# Patient Record
Sex: Female | Born: 1975 | Race: White | Hispanic: No | Marital: Married | Smoking: Never smoker
Health system: Southern US, Community
[De-identification: ages and names within clinical notes are randomized; demographics above are authoritative.]

## PROBLEM LIST (undated history)

## (undated) ENCOUNTER — Inpatient Hospital Stay (HOSPITAL_COMMUNITY): Payer: BC Managed Care – PPO

## (undated) DIAGNOSIS — F32A Depression, unspecified: Secondary | ICD-10-CM

## (undated) DIAGNOSIS — Z8759 Personal history of other complications of pregnancy, childbirth and the puerperium: Secondary | ICD-10-CM

## (undated) DIAGNOSIS — F329 Major depressive disorder, single episode, unspecified: Secondary | ICD-10-CM

## (undated) DIAGNOSIS — F419 Anxiety disorder, unspecified: Secondary | ICD-10-CM

## (undated) DIAGNOSIS — G43909 Migraine, unspecified, not intractable, without status migrainosus: Secondary | ICD-10-CM

## (undated) DIAGNOSIS — R112 Nausea with vomiting, unspecified: Secondary | ICD-10-CM

## (undated) DIAGNOSIS — O139 Gestational [pregnancy-induced] hypertension without significant proteinuria, unspecified trimester: Secondary | ICD-10-CM

## (undated) DIAGNOSIS — Z9889 Other specified postprocedural states: Secondary | ICD-10-CM

## (undated) HISTORY — PX: SHOULDER ARTHROSCOPY: SHX128

## (undated) HISTORY — PX: SINUS SURGERY WITH INSTATRAK: SHX5215

## (undated) HISTORY — DX: Gestational (pregnancy-induced) hypertension without significant proteinuria, unspecified trimester: O13.9

## (undated) HISTORY — PX: BREAST BIOPSY: SHX20

## (undated) HISTORY — DX: Personal history of other complications of pregnancy, childbirth and the puerperium: Z87.59

---

## 2007-07-03 ENCOUNTER — Inpatient Hospital Stay (HOSPITAL_COMMUNITY): Admission: AD | Admit: 2007-07-03 | Discharge: 2007-07-03 | Payer: Self-pay | Admitting: Obstetrics and Gynecology

## 2007-07-04 ENCOUNTER — Inpatient Hospital Stay (HOSPITAL_COMMUNITY): Admission: AD | Admit: 2007-07-04 | Discharge: 2007-07-07 | Payer: Self-pay | Admitting: Obstetrics and Gynecology

## 2007-07-08 ENCOUNTER — Inpatient Hospital Stay (HOSPITAL_COMMUNITY): Admission: AD | Admit: 2007-07-08 | Discharge: 2007-07-08 | Payer: Self-pay | Admitting: Obstetrics and Gynecology

## 2007-07-22 ENCOUNTER — Inpatient Hospital Stay (HOSPITAL_COMMUNITY): Admission: AD | Admit: 2007-07-22 | Discharge: 2007-07-22 | Payer: Self-pay | Admitting: Obstetrics and Gynecology

## 2010-12-09 NOTE — H&P (Signed)
NAME:  Miranda [name], [place] NO.:  [id number]   MEDICAL RECORD NO.:  [id number]          PATIENT TYPE:  INP   LOCATION:  9160                          FACILITY:  [facility]   PHYSICIAN:  Duke Salvia. Marcelle Overlie, M.D.DATE OF BIRTH:  1976-05-21   DATE OF ADMISSION:  07/04/2007  DATE OF DISCHARGE:                              HISTORY & PHYSICAL   CHIEF COMPLAINT:  Preeclampsia at term.   HISTORY OF PRESENT ILLNESS:  A 35 year old G2, P57 with EDD of July 23, 2007.  She was seen over the weekend complaining of some swelling  and headache.  Evaluation at [facility] by her report showed normal lab work.  She was told to come back today.  This morning, she is complaining of  blurred vision and headache.  3+ protein, BP 118/88.  She is admitted  now for labor induction secondary to preeclampsia at term.   GBS was negative.  Her blood type is A positive, rubella titers immune.   PAST MEDICAL HISTORY:  Please see her [facility] form for details.   PHYSICAL EXAMINATION:  VITAL SIGNS:  Temperature 98.2, blood pressure  118/88.  HEENT:  Unremarkable.  NECK:  Supple without masses.  LUNGS:  Clear.  CARDIOVASCULAR:  Regular rate and rhythm without murmurs, rubs, or  gallops.  PELVIC:  37-cm fundal height, vertex.  Fetal heart rate 140s.  Cervix  was a 1 cm, 50%, posterior.  Membranes intact.  EXTREMITIES:  1+ edema.  Reflexes 2+, no clonus.   IMPRESSION:  1. 37+-week intrauterine pregnancy.  2. Mild preeclampsia.   PLAN:  Will admit for labor induction.  Recheck PIH labs.      Richard M. Marcelle Overlie, M.D.  Electronically Signed     [facility]/MEDQ  D:  07/04/2007  T:  07/04/2007  Job:  [id number]

## 2011-05-04 LAB — COMPREHENSIVE METABOLIC PANEL WITH GFR
ALT: 14
ALT: 15
ALT: 17
AST: 17
AST: 21
AST: 23
Albumin: 2.3 — ABNORMAL LOW
Albumin: 2.5 — ABNORMAL LOW
Albumin: 2.8 — ABNORMAL LOW
Alkaline Phosphatase: 101
Alkaline Phosphatase: 103
Alkaline Phosphatase: 116
BUN: 11
BUN: 9
BUN: 9
CO2: 22
CO2: 24
CO2: 26
Calcium: 8.6
Calcium: 8.7
Calcium: 8.8
Chloride: 108
Chloride: 108
Chloride: 111
Creatinine, Ser: 0.66
Creatinine, Ser: 0.71
Creatinine, Ser: 0.83
GFR calc non Af Amer: >60
GFR calc non Af Amer: >60
GFR calc non Af Amer: >60
Glucose, Bld: 104 — ABNORMAL HIGH
Glucose, Bld: 87
Glucose, Bld: 92
Potassium: 4
Potassium: 4.2
Potassium: 4.3
Sodium: 137
Sodium: 139
Sodium: 141
Total Bilirubin: 0.4
Total Bilirubin: 0.5
Total Bilirubin: 0.7
Total Protein: 4.7 — ABNORMAL LOW
Total Protein: 5 — ABNORMAL LOW
Total Protein: 5.5 — ABNORMAL LOW

## 2011-05-04 LAB — URINALYSIS, ROUTINE W REFLEX MICROSCOPIC
Bilirubin Urine: NEGATIVE
Glucose, UA: NEGATIVE
Ketones, ur: NEGATIVE
Leukocytes, UA: NEGATIVE
Nitrite: NEGATIVE
Protein, ur: 30 — AB
Specific Gravity, Urine: <1.005 — ABNORMAL LOW
Urobilinogen, UA: 0.2
pH: 6

## 2011-05-04 LAB — CBC
HCT: 33 — ABNORMAL LOW
HCT: 34 — ABNORMAL LOW
HCT: 34.2 — ABNORMAL LOW
HCT: 37.4
HCT: 37.7
Hemoglobin: 11.6 — ABNORMAL LOW
Hemoglobin: 11.9 — ABNORMAL LOW
Hemoglobin: 12.9
MCHC: 34.2
MCHC: 34.5
MCHC: 34.6
MCHC: 34.7
MCHC: 35
MCHC: 35.3
MCV: 96.1
MCV: 96.4
MCV: 97.2
MCV: 97.6
MCV: 97.7
Platelets: 143 — ABNORMAL LOW
Platelets: 166
Platelets: 188
Platelets: 234
RBC: 3.46 — ABNORMAL LOW
RBC: 3.51 — ABNORMAL LOW
RBC: 3.82 — ABNORMAL LOW
RBC: 3.83 — ABNORMAL LOW
RBC: 3.91
RDW: 12.6
RDW: 12.6
RDW: 12.7
WBC: 10.2
WBC: 10.8 — ABNORMAL HIGH
WBC: 11.7 — ABNORMAL HIGH
WBC: 9.5

## 2011-05-04 LAB — COMPREHENSIVE METABOLIC PANEL
ALT: 15
Alkaline Phosphatase: 95
BUN: 6
CO2: 26
Calcium: 8.3 — ABNORMAL LOW
GFR calc non Af Amer: >60
Glucose, Bld: 87
Potassium: 3.9
Sodium: 139

## 2011-05-04 LAB — URIC ACID: Uric Acid, Serum: 5.7

## 2011-05-04 LAB — LACTATE DEHYDROGENASE
LDH: 143
LDH: 160
LDH: 173

## 2011-05-04 LAB — URINE MICROSCOPIC-ADD ON

## 2011-05-04 LAB — RPR: RPR Ser Ql: NONREACTIVE

## 2013-01-16 ENCOUNTER — Other Ambulatory Visit: Payer: Self-pay | Admitting: Obstetrics and Gynecology

## 2013-01-16 DIAGNOSIS — N6001 Solitary cyst of right breast: Secondary | ICD-10-CM

## 2013-01-16 DIAGNOSIS — N63 Unspecified lump in unspecified breast: Secondary | ICD-10-CM

## 2013-01-25 ENCOUNTER — Ambulatory Visit
Admission: RE | Admit: 2013-01-25 | Discharge: 2013-01-25 | Disposition: A | Discharge status: Home / Self Care | Payer: PRIVATE HEALTH INSURANCE | Source: Ambulatory Visit | Attending: Obstetrics and Gynecology | Admitting: Obstetrics and Gynecology

## 2013-01-25 ENCOUNTER — Other Ambulatory Visit: Payer: Self-pay | Admitting: Obstetrics and Gynecology

## 2013-01-25 DIAGNOSIS — N63 Unspecified lump in unspecified breast: Secondary | ICD-10-CM

## 2013-01-25 DIAGNOSIS — N6001 Solitary cyst of right breast: Secondary | ICD-10-CM

## 2013-01-31 ENCOUNTER — Other Ambulatory Visit: Payer: Self-pay | Admitting: Obstetrics and Gynecology

## 2013-01-31 ENCOUNTER — Ambulatory Visit
Admission: RE | Admit: 2013-01-31 | Discharge: 2013-01-31 | Disposition: A | Discharge status: Home / Self Care | Payer: PRIVATE HEALTH INSURANCE | Source: Ambulatory Visit | Attending: Obstetrics and Gynecology | Admitting: Obstetrics and Gynecology

## 2013-01-31 DIAGNOSIS — N63 Unspecified lump in unspecified breast: Secondary | ICD-10-CM

## 2013-08-22 ENCOUNTER — Other Ambulatory Visit: Payer: Self-pay

## 2016-09-10 ENCOUNTER — Encounter (HOSPITAL_COMMUNITY): Payer: Self-pay

## 2016-09-10 NOTE — H&P (Signed)
NAME:  Miranda [name], [name] NO.:  [id number]  MEDICAL RECORD NO.:  [id number]  LOCATION:                                 FACILITY:  PHYSICIAN:  Ralene Bathe. Matthew Saras, M.D.    DATE OF BIRTH:  DATE OF ADMISSION: DATE OF DISCHARGE:                             HISTORY & PHYSICAL   CHIEF COMPLAINT:  Missed AB.  HISTORY OF PRESENT ILLNESS:  A 41 year old, G3, P 2-0-0-2.  This patient had positive FHR on early confirmation ultrasound, presented for followup OB visit at 9-10 weeks, FHR was not noted, confirmed by ultrasound.  Miranda [name] has had some minimal spotting, presents now for D and E.  this procedure including specific risks related to bleeding, infection, other complications may require additional surgery reviewed with her which Miranda [name] understands and accepts.  Prior pregnancies in 2005, delivered a 7 pounds 6 ounces female vaginally and in 2008, 5 pounds 12 ounces female.  PAST MEDICAL HISTORY:  Miranda [name] did have some postpartum bleeding after her first vaginal delivery.  Please see her [facility] form for details of her past medical history.  PHYSICAL EXAMINATION:  VITAL SIGNS:  Temp 98.2, blood pressure 120/78. HEENT unremarkable. NECK:  Supple without masses. LUNGS:  Clear. CARDIOVASCULAR:  Regular rate and rhythm without murmurs, rubs, gallops noted. BREASTS:  Without masses. ABDOMEN:  Soft, flat, and nontender.  Vulva, vagina, and cervix normal. Uterus was 7-week size, mid position.  Adnexa negative.  Blood type is A positive.  IMPRESSION:  Missed abortion.  PLAN:  D and E procedure and risks discussed as above.    Tanveer Dobberstein M. Matthew Saras, M.D.   ______________________________ Ralene Bathe. Matthew Saras, M.D.   [facility]/MEDQ  D:  09/10/2016  T:  09/10/2016  Job:  [id number]

## 2016-09-10 NOTE — H&P (Signed)
Miranda [name]  DICTATION # [id number] CSN# [id number]   Margarette Asal, MD 09/10/2016 9:31 AM

## 2016-09-11 ENCOUNTER — Ambulatory Visit (HOSPITAL_COMMUNITY): Payer: BLUE CROSS/BLUE SHIELD | Admitting: Anesthesiology

## 2016-09-11 ENCOUNTER — Encounter (HOSPITAL_COMMUNITY)
Admission: RE | Disposition: A | Discharge status: Home / Self Care | Payer: Self-pay | Source: Ambulatory Visit | Attending: Obstetrics and Gynecology

## 2016-09-11 ENCOUNTER — Encounter (HOSPITAL_COMMUNITY): Payer: Self-pay

## 2016-09-11 ENCOUNTER — Ambulatory Visit (HOSPITAL_COMMUNITY)
Admission: RE | Admit: 2016-09-11 | Discharge: 2016-09-11 | Disposition: A | Discharge status: Home / Self Care | Payer: BLUE CROSS/BLUE SHIELD | Source: Ambulatory Visit | Attending: Obstetrics and Gynecology | Admitting: Obstetrics and Gynecology

## 2016-09-11 DIAGNOSIS — O021 Missed abortion: Secondary | ICD-10-CM | POA: Insufficient documentation

## 2016-09-11 HISTORY — DX: Anxiety disorder, unspecified: F41.9

## 2016-09-11 HISTORY — PX: DILATION AND EVACUATION: SHX1459

## 2016-09-11 HISTORY — DX: Major depressive disorder, single episode, unspecified: F32.9

## 2016-09-11 HISTORY — DX: Depression, unspecified: F32.A

## 2016-09-11 HISTORY — DX: Other specified postprocedural states: Z98.890

## 2016-09-11 HISTORY — DX: Nausea with vomiting, unspecified: R11.2

## 2016-09-11 SURGERY — DILATION AND EVACUATION, UTERUS
Anesthesia: Monitor Anesthesia Care

## 2016-09-11 MED ORDER — DEXAMETHASONE SODIUM PHOSPHATE 10 MG/ML IJ SOLN
INTRAMUSCULAR | Status: AC
  Filled 2016-09-11: qty 1

## 2016-09-11 MED ORDER — PROPOFOL 500 MG/50ML IV EMUL
INTRAVENOUS | Status: AC
  Filled 2016-09-11: qty 50

## 2016-09-11 MED ORDER — OXYCODONE HCL 5 MG/5ML PO SOLN
5.0000 mg | Freq: Once | ORAL | Status: DC | PRN
Start: 2016-09-11 — End: 2016-09-11

## 2016-09-11 MED ORDER — DEXAMETHASONE SODIUM PHOSPHATE 10 MG/ML IJ SOLN
INTRAMUSCULAR | Status: DC | PRN
  Administered 2016-09-11: 10 mg via INTRAVENOUS

## 2016-09-11 MED ORDER — SCOPOLAMINE 1 MG/3DAYS TD PT72
MEDICATED_PATCH | TRANSDERMAL | Status: AC
  Filled 2016-09-11: qty 1

## 2016-09-11 MED ORDER — ACETAMINOPHEN 160 MG/5ML PO SOLN: 325.0000 mg | ORAL | Status: DC | PRN

## 2016-09-11 MED ORDER — LIDOCAINE HCL 1 % IJ SOLN
INTRAMUSCULAR | Status: AC
  Filled 2016-09-11: qty 20

## 2016-09-11 MED ORDER — KETOROLAC TROMETHAMINE 30 MG/ML IJ SOLN
INTRAMUSCULAR | Status: AC
  Filled 2016-09-11: qty 1

## 2016-09-11 MED ORDER — OXYCODONE HCL 5 MG PO TABS: 5.0000 mg | ORAL_TABLET | Freq: Once | ORAL | Status: DC | PRN

## 2016-09-11 MED ORDER — PROPOFOL 500 MG/50ML IV EMUL
INTRAVENOUS | Status: DC | PRN
  Administered 2016-09-11: 200 ug/kg/min via INTRAVENOUS

## 2016-09-11 MED ORDER — MIDAZOLAM HCL 5 MG/5ML IJ SOLN
INTRAMUSCULAR | Status: DC | PRN
  Administered 2016-09-11: 2 mg via INTRAVENOUS

## 2016-09-11 MED ORDER — IBUPROFEN 200 MG PO TABS: 800.0000 mg | ORAL_TABLET | Freq: Three times a day (TID) | ORAL | 1 refills | Status: DC | PRN

## 2016-09-11 MED ORDER — ACETAMINOPHEN 325 MG PO TABS: 325.0000 mg | ORAL_TABLET | ORAL | Status: DC | PRN

## 2016-09-11 MED ORDER — SCOPOLAMINE 1 MG/3DAYS TD PT72
1.0000 | MEDICATED_PATCH | Freq: Once | TRANSDERMAL | Status: DC
  Administered 2016-09-11: 1.5 mg via TRANSDERMAL

## 2016-09-11 MED ORDER — DEXTROSE 5 % IV SOLN
2.0000 g | Freq: Once | INTRAVENOUS | Status: AC
  Administered 2016-09-11: 2 g via INTRAVENOUS
  Filled 2016-09-11: qty 2

## 2016-09-11 MED ORDER — MIDAZOLAM HCL 2 MG/2ML IJ SOLN
INTRAMUSCULAR | Status: AC
  Filled 2016-09-11: qty 2

## 2016-09-11 MED ORDER — CEFOTETAN DISODIUM 1 G IJ SOLR: 1.0000 g | Freq: Once | INTRAMUSCULAR | Status: DC

## 2016-09-11 MED ORDER — MEPERIDINE HCL 25 MG/ML IJ SOLN: 6.2500 mg | INTRAMUSCULAR | Status: DC | PRN

## 2016-09-11 MED ORDER — ONDANSETRON HCL 4 MG/2ML IJ SOLN
INTRAMUSCULAR | Status: DC | PRN
  Administered 2016-09-11: 4 mg via INTRAVENOUS

## 2016-09-11 MED ORDER — FENTANYL CITRATE (PF) 100 MCG/2ML IJ SOLN
INTRAMUSCULAR | Status: AC
  Filled 2016-09-11: qty 2

## 2016-09-11 MED ORDER — KETOROLAC TROMETHAMINE 30 MG/ML IJ SOLN
INTRAMUSCULAR | Status: DC | PRN
  Administered 2016-09-11: 30 mg via INTRAVENOUS

## 2016-09-11 MED ORDER — FENTANYL CITRATE (PF) 100 MCG/2ML IJ SOLN
INTRAMUSCULAR | Status: DC | PRN
  Administered 2016-09-11: 100 ug via INTRAVENOUS

## 2016-09-11 MED ORDER — LACTATED RINGERS IV SOLN
INTRAVENOUS | Status: DC
  Administered 2016-09-11: 125 mL/h via INTRAVENOUS

## 2016-09-11 MED ORDER — LIDOCAINE HCL (CARDIAC) 20 MG/ML IV SOLN
INTRAVENOUS | Status: AC
  Filled 2016-09-11: qty 5

## 2016-09-11 MED ORDER — OXYCODONE-ACETAMINOPHEN 2.5-325 MG PO TABS: 1.0000 | ORAL_TABLET | ORAL | 0 refills | Status: DC | PRN

## 2016-09-11 MED ORDER — FENTANYL CITRATE (PF) 100 MCG/2ML IJ SOLN: 25.0000 ug | INTRAMUSCULAR | Status: DC | PRN

## 2016-09-11 MED ORDER — ONDANSETRON HCL 4 MG/2ML IJ SOLN: 4.0000 mg | Freq: Once | INTRAMUSCULAR | Status: DC | PRN

## 2016-09-11 MED ORDER — ONDANSETRON HCL 4 MG/2ML IJ SOLN
INTRAMUSCULAR | Status: AC
  Filled 2016-09-11: qty 2

## 2016-09-11 MED ORDER — LIDOCAINE HCL 1 % IJ SOLN
INTRAMUSCULAR | Status: DC | PRN
  Administered 2016-09-11: 10 mL

## 2016-09-11 SURGICAL SUPPLY — 18 items
CATH ROBINSON RED A/P 16FR (CATHETERS) ×2 IMPLANT
CLOTH BEACON ORANGE TIMEOUT ST (SAFETY) ×2 IMPLANT
DECANTER SPIKE VIAL GLASS SM (MISCELLANEOUS) ×2 IMPLANT
GLOVE BIO SURGEON STRL SZ7 (GLOVE) ×4 IMPLANT
GLOVE BIOGEL PI IND STRL 7.0 (GLOVE) ×1 IMPLANT
GLOVE BIOGEL PI INDICATOR 7.0 (GLOVE) ×1
GOWN STRL REUS W/TWL LRG LVL3 (GOWN DISPOSABLE) ×4 IMPLANT
KIT BERKELEY 1ST TRIMESTER 3/8 (MISCELLANEOUS) ×2 IMPLANT
NS IRRIG 1000ML POUR BTL (IV SOLUTION) ×2 IMPLANT
PACK VAGINAL MINOR WOMEN LF (CUSTOM PROCEDURE TRAY) ×2 IMPLANT
PAD OB MATERNITY 4.3X12.25 (PERSONAL CARE ITEMS) ×2 IMPLANT
PAD PREP 24X48 CUFFED NSTRL (MISCELLANEOUS) ×2 IMPLANT
SET BERKELEY SUCTION TUBING (SUCTIONS) ×2 IMPLANT
TOWEL OR 17X24 6PK STRL BLUE (TOWEL DISPOSABLE) ×4 IMPLANT
VACURETTE 10 RIGID CVD (CANNULA) IMPLANT
VACURETTE 7MM CVD STRL WRAP (CANNULA) IMPLANT
VACURETTE 8 RIGID CVD (CANNULA) IMPLANT
VACURETTE 9 RIGID CVD (CANNULA) IMPLANT

## 2016-09-11 NOTE — Op Note (Signed)
Preoperative diagnosis: Missed AB  Postoperative diagnosis: Same  Procedure: Suction D&E  Surgeon: Matthew Saras  Anesthesia: MAC  Procedure and findings:  The patient taken the operating room after an adequate level of anesthesia was obtained, with the legs in stirrups the perineum and vagina were prepped and draped and the bladder was drained appropriate timeouts were taken at that point. The uterus was 8 weeks size mid position mobile, adnexa negative. Speculum was positioned paracervical block was then created by infiltrating at 3 and 9:00 submucosally 5-7 cc 1% plain Xylocaine at each site after negative aspiration. Uterus then sounded to 8-9 cm, progressively dilated to a 27 Pratt, 7 mm curved suction curet was then used to curette a moderate amount of tissue, when no further tissue was noted, a medium curette was used to explore the cavity revealing it to be clean. There was minimal bleeding portions were sent both for pathology and genetic studies. She tolerated this well went to recovery room in good condition.  Dictated with CoyoteD.

## 2016-09-11 NOTE — OR Nursing (Signed)
No preop labs needed per verbal orders Dr. Matthew Saras

## 2016-09-11 NOTE — Anesthesia Preprocedure Evaluation (Signed)
Anesthesia Evaluation  Patient identified by MRN, date of birth, ID band Patient awake    Reviewed: NPO status , Patient's Chart, lab work & pertinent test results, reviewed documented beta blocker date and time   Airway Mallampati: I       Dental no notable dental hx.    Pulmonary    Pulmonary exam normal        Cardiovascular negative cardio ROS Normal cardiovascular exam     Neuro/Psych negative neurological ROS     GI/Hepatic negative GI ROS, Neg liver ROS,   Endo/Other  negative endocrine ROS  Renal/GU negative Renal ROS  negative genitourinary   Musculoskeletal negative musculoskeletal ROS (+)   Abdominal Normal abdominal exam  (+)   Peds  Hematology negative hematology ROS (+)   Anesthesia Other Findings   Reproductive/Obstetrics negative OB ROS                             Anesthesia Physical Anesthesia Plan  ASA: II  Anesthesia Plan: MAC   Post-op Pain Management:    Induction:   Airway Management Planned: Natural Airway and Simple Face Mask  Additional Equipment:   Intra-op Plan:   Post-operative Plan:   Informed Consent: I have reviewed the patients History and Physical, chart, labs and discussed the procedure including the risks, benefits and alternatives for the proposed anesthesia with the patient or authorized representative who has indicated his/her understanding and acceptance.     Plan Discussed with: CRNA and Surgeon  Anesthesia Plan Comments:         Anesthesia Quick Evaluation

## 2016-09-11 NOTE — Discharge Instructions (Signed)

## 2016-09-11 NOTE — Transfer of Care (Signed)
Immediate Anesthesia Transfer of Care Note  Patient: Miranda [name]  Procedure(s) Performed: Procedure(s): DILATATION AND EVACUATION (N/A)  Patient Location: PACU  Anesthesia Type:MAC  Level of Consciousness: awake  Airway & Oxygen Therapy: Patient Spontanous Breathing  Post-op Assessment: Report given to RN and Post -op Vital signs reviewed and stable  Post vital signs: stable  Last Vitals:  Vitals:   09/11/16 1253  BP: 108/74  Pulse: 64  Resp: 16  Temp: 37.1 C    Last Pain:  Vitals:   09/11/16 1253  TempSrc: Oral      Patients Stated Pain Goal: 4 (51/70/01 7494)  Complications: No apparent anesthesia complications

## 2016-09-11 NOTE — Progress Notes (Signed)
The patient was re-examined with no change in status 

## 2016-09-11 NOTE — Anesthesia Postprocedure Evaluation (Addendum)
Anesthesia Post Note  Patient: Miranda [name]  Procedure(s) Performed: Procedure(s) (LRB): DILATATION AND EVACUATION (N/A)  Patient location during evaluation: PACU Anesthesia Type: MAC Level of consciousness: awake and sedated Pain management: pain level controlled Vital Signs Assessment: post-procedure vital signs reviewed and stable Respiratory status: spontaneous breathing Cardiovascular status: stable Postop Assessment: no signs of nausea or vomiting Anesthetic complications: no        Last Vitals:  Vitals:   09/11/16 1253  BP: 108/74  Pulse: 64  Resp: 16  Temp: 37.1 C    Last Pain:  Vitals:   09/11/16 1253  TempSrc: Oral   Pain Goal: Patients Stated Pain Goal: 4 (09/11/16 1253)               [place]

## 2016-09-13 ENCOUNTER — Encounter (HOSPITAL_COMMUNITY): Payer: Self-pay | Admitting: Obstetrics and Gynecology

## 2016-10-01 LAB — CHROMOSOME STD, POC(TISSUE)-NCBH

## 2017-01-01 NOTE — Addendum Note (Signed)
Addendum  created 01/01/17 1016 by Lyn Hollingshead, MD   Sign clinical note

## 2017-10-06 ENCOUNTER — Other Ambulatory Visit: Payer: Self-pay

## 2017-10-06 ENCOUNTER — Encounter (HOSPITAL_BASED_OUTPATIENT_CLINIC_OR_DEPARTMENT_OTHER): Payer: Self-pay | Admitting: Emergency Medicine

## 2017-10-06 ENCOUNTER — Emergency Department (HOSPITAL_COMMUNITY): Payer: Managed Care, Other (non HMO)

## 2017-10-06 ENCOUNTER — Emergency Department (HOSPITAL_BASED_OUTPATIENT_CLINIC_OR_DEPARTMENT_OTHER)
Admission: EM | Admit: 2017-10-06 | Discharge: 2017-10-07 | Disposition: A | Discharge status: Home / Self Care | Payer: Managed Care, Other (non HMO) | Attending: Emergency Medicine | Admitting: Emergency Medicine

## 2017-10-06 ENCOUNTER — Emergency Department (HOSPITAL_BASED_OUTPATIENT_CLINIC_OR_DEPARTMENT_OTHER): Payer: Managed Care, Other (non HMO)

## 2017-10-06 DIAGNOSIS — R202 Paresthesia of skin: Secondary | ICD-10-CM | POA: Insufficient documentation

## 2017-10-06 DIAGNOSIS — G43809 Other migraine, not intractable, without status migrainosus: Secondary | ICD-10-CM | POA: Diagnosis not present

## 2017-10-06 DIAGNOSIS — Z79899 Other long term (current) drug therapy: Secondary | ICD-10-CM | POA: Diagnosis not present

## 2017-10-06 HISTORY — DX: Migraine, unspecified, not intractable, without status migrainosus: G43.909

## 2017-10-06 LAB — PREGNANCY, URINE: Preg Test, Ur: NEGATIVE

## 2017-10-06 LAB — CBC WITH DIFFERENTIAL/PLATELET
Basophils Absolute: 0.1 10*3/uL (ref 0.0–0.1)
Basophils Relative: 1 %
Eosinophils Absolute: 0.2 10*3/uL (ref 0.0–0.7)
Eosinophils Relative: 3 %
HEMATOCRIT: 40.1 % (ref 36.0–46.0)
Hemoglobin: 13.5 g/dL (ref 12.0–15.0)
LYMPHS ABS: 1.6 10*3/uL (ref 0.7–4.0)
Lymphocytes Relative: 22 %
MCH: 32.1 pg (ref 26.0–34.0)
MCHC: 33.7 g/dL (ref 30.0–36.0)
MCV: 95.2 fL (ref 78.0–100.0)
MONO ABS: 0.6 10*3/uL (ref 0.1–1.0)
MONOS PCT: 8 %
NEUTROS ABS: 4.9 10*3/uL (ref 1.7–7.7)
Neutrophils Relative %: 66 %
Platelets: 229 10*3/uL (ref 150–400)
RBC: 4.21 MIL/uL (ref 3.87–5.11)
RDW: 11.8 % (ref 11.5–15.5)
WBC: 7.4 10*3/uL (ref 4.0–10.5)

## 2017-10-06 LAB — COMPREHENSIVE METABOLIC PANEL
ALT: 48 U/L (ref 14–54)
AST: 41 U/L (ref 15–41)
Albumin: 4.8 g/dL (ref 3.5–5.0)
Alkaline Phosphatase: 31 U/L — ABNORMAL LOW (ref 38–126)
Anion gap: 11 (ref 5–15)
BILIRUBIN TOTAL: 0.9 mg/dL (ref 0.3–1.2)
BUN: 12 mg/dL (ref 6–20)
CALCIUM: 9.3 mg/dL (ref 8.9–10.3)
CO2: 24 mmol/L (ref 22–32)
Chloride: 104 mmol/L (ref 101–111)
Creatinine, Ser: 0.69 mg/dL (ref 0.44–1.00)
GFR calc Af Amer: >60 mL/min (ref >60)
Glucose, Bld: 86 mg/dL (ref 65–99)
POTASSIUM: 3.9 mmol/L (ref 3.5–5.1)
Sodium: 139 mmol/L (ref 135–145)
TOTAL PROTEIN: 7 g/dL (ref 6.5–8.1)

## 2017-10-06 LAB — URINALYSIS, ROUTINE W REFLEX MICROSCOPIC
BILIRUBIN URINE: NEGATIVE
GLUCOSE, UA: NEGATIVE mg/dL
Hgb urine dipstick: NEGATIVE
KETONES UR: 15 mg/dL — AB
LEUKOCYTES UA: NEGATIVE
Nitrite: NEGATIVE
PROTEIN: NEGATIVE mg/dL
Specific Gravity, Urine: 1.01 (ref 1.005–1.030)
pH: 8 (ref 5.0–8.0)

## 2017-10-06 LAB — MAGNESIUM: MAGNESIUM: 2.1 mg/dL (ref 1.7–2.4)

## 2017-10-06 MED ORDER — IOPAMIDOL (ISOVUE-370) INJECTION 76%
INTRAVENOUS | Status: AC
  Administered 2017-10-07: 50 mL via INTRAVENOUS
  Filled 2017-10-06: qty 50

## 2017-10-06 NOTE — ED Provider Notes (Signed)
[facility] EMERGENCY DEPARTMENT Provider Note   CSN: [id number] Arrival date & time: 10/06/17  1404     History   Chief Complaint Chief Complaint  Patient presents with  . Numbness    HPI Miranda [name] is a 42 y.o. female.  HPI Patient presents with right foot numbness that started yesterday morning.  She states that this morning she began having right hand numbness, bilateral lower facial numbness and difficulty speaking.  She also complains of a right-sided headache.  Denies any visual changes.  Recently treated for urinary tract section.  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  Associated with visual loss in her left eye.  Has not seen a neurologist or had a previous MRI. Past Medical History:  Diagnosis Date  . Anxiety   . Depression   . Migraines   . PONV (postoperative nausea and vomiting)     There are no active problems to display for this patient.   Past Surgical History:  Procedure Laterality Date  . DILATION AND EVACUATION N/A 09/11/2016   Procedure: DILATATION AND EVACUATION;  Surgeon: Molli Posey, MD;  Location: [facility];  Service: Gynecology;  Laterality: N/A;  . SHOULDER ARTHROSCOPY    . SINUS SURGERY WITH INSTATRAK      OB History    Gravida Para Term Preterm AB Living   1             SAB TAB Ectopic Multiple Live Births                   Home Medications    Prior to Admission medications   Medication Sig Start Date End Date Taking? Authorizing Provider  ibuprofen (ADVIL) 200 MG tablet Take 4 tablets (800 mg total) by mouth every 8 (eight) hours as needed. 09/11/16   Molli Posey, MD  oxycodone-acetaminophen (PERCOCET) 2.5-325 MG tablet Take 1 tablet by mouth every 4 (four) hours as needed for pain. 09/11/16   Molli Posey, MD  Prenatal Vit-Fe Fumarate-FA (PRENATAL MULTIVITAMIN) TABS tablet Take 1 tablet by mouth daily at 12 noon.    [provider]  sertraline (ZOLOFT)  25 MG tablet Take 25 mg by mouth daily.    [provider]    Family History No family history on file.  Social History Social History   Tobacco Use  . Smoking status: Never Smoker  . Smokeless tobacco: Never Used  Substance Use Topics  . Alcohol use: Yes    Comment: none since pregnancy  . Drug use: No     Allergies   Patient has no known allergies.   Review of Systems Review of Systems  Constitutional: Negative for chills and fever.  HENT: Negative for congestion, sinus pressure, sinus pain, sore throat and trouble swallowing.   Eyes: Negative for visual disturbance.  Respiratory: Negative for cough and shortness of breath.   Cardiovascular: Negative for chest pain.  Gastrointestinal: Negative for abdominal pain, diarrhea, nausea and vomiting.  Genitourinary: Negative for dysuria and flank pain.  Musculoskeletal: Negative for back pain, myalgias and neck pain.  Skin: Negative for rash and wound.  Neurological: Positive for speech difficulty, numbness and headaches. Negative for dizziness, tremors, syncope and weakness.  All other systems reviewed and are negative.    Physical Exam Updated Vital Signs BP 129/82   Pulse 86   Temp 98.4 F (36.9 C) (Oral)   Resp (!) 22   Ht 5\' 7"  (1.702 m)  Wt 68 kg (150 lb)   LMP 09/16/2017   SpO2 100%   Breastfeeding? Unknown   BMI 23.49 kg/m   Physical Exam  Constitutional: She is oriented to person, place, and time. She appears well-developed and well-nourished. No distress.  HENT:  Head: Normocephalic and atraumatic.  Mouth/Throat: Oropharynx is clear and moist. No oropharyngeal exudate.  Question mild decreased sensation to light touch bilateral lower face.  Otherwise cranial nerves II through XII grossly intact.  Eyes: EOM are normal. Pupils are equal, round, and reactive to light.  No nystagmus.  Visual fields intact.  Neck: Normal range of motion. Neck supple.  No meningismus.  No posterior midline  cervical tenderness to palpation.  No cervical lymphadenopathy.  Cardiovascular: Normal rate and regular rhythm. Exam reveals no gallop and no friction rub.  No murmur heard. Pulmonary/Chest: Effort normal and breath sounds normal. No stridor. No respiratory distress. She has no wheezes. She has no rales. She exhibits no tenderness.  Abdominal: Soft. Bowel sounds are normal. There is no tenderness. There is no rebound and no guarding.  Musculoskeletal: Normal range of motion. She exhibits no edema or tenderness.  2+ distal pulses in all extremities.  No midline thoracic or lumbar tenderness.  No CVA tenderness.  Lymphadenopathy:    She has no cervical adenopathy.  Neurological: She is alert and oriented to person, place, and time.  Mildly anxious appearing.  5/5 motor in all extremities.  Patient has decreased sensation to the right fourth and fifth digits of the right hand mostly on the medial surface.  Intrinsic motor of the right hand intact.  Mild decreased sensation to light touch of the right foot.  3+ patellar DTR's bilaterally.  Skin: Skin is warm and dry. Capillary refill takes less than 2 seconds. No rash noted. She is not diaphoretic. No erythema.  Psychiatric: She has a normal mood and affect. Her behavior is normal.  Nursing note and vitals reviewed.    ED Treatments / Results  Labs (all labs ordered are listed, but only abnormal results are displayed) Labs Reviewed  COMPREHENSIVE METABOLIC PANEL - Abnormal; Notable for the following components:      Result Value   Alkaline Phosphatase 31 (*)    All other components within normal limits  URINALYSIS, ROUTINE W REFLEX MICROSCOPIC - Abnormal; Notable for the following components:   Ketones, ur 15 (*)    All other components within normal limits  CBC WITH DIFFERENTIAL/PLATELET  MAGNESIUM  PREGNANCY, URINE    EKG  EKG Interpretation  Date/Time:  Wednesday October 06 2017 17:42:07 EDT Ventricular Rate:  79 PR Interval:      QRS Duration: 89 QT Interval:  371 QTC Calculation: 426 R Axis:   11 Text Interpretation:  Sinus rhythm Ventricular premature complex Abnormal R-wave progression, early transition Confirmed by Julianne Rice [phone]) on 10/06/2017 7:08:40 PM       Radiology Ct Head Wo Contrast  Result Date: 10/06/2017 CLINICAL DATA:  Right-sided weakness and numbness EXAM: CT HEAD WITHOUT CONTRAST TECHNIQUE: Contiguous axial images were obtained from the base of the skull through the vertex without intravenous contrast. COMPARISON:  None. FINDINGS: Brain: No evidence of acute infarction, hemorrhage, hydrocephalus, extra-axial collection or mass lesion/mass effect. Vascular: No hyperdense vessel or unexpected calcification. Skull: Negative Sinuses/Orbits: Negative Other: None IMPRESSION: Negative CT head Electronically Signed   By: Franchot Gallo M.D.   On: 10/06/2017 15:15    Procedures Procedures (including critical care time)  Medications Ordered in ED Medications -  No data to display   Initial Impression / Assessment and Plan / ED Course  I have reviewed the triage vital signs and the nursing notes.  Pertinent labs & imaging results that were available during my care of the patient were reviewed by me and considered in my medical decision making (see chart for details).    CT head is normal.  Electrolytes normal.  Exam is not consistent with stroke.  Seen by Dr. Sherley Bounds, neurosurgery in the emergency department.  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  Testing the patient needs MRI to rule out central cause for the patient's symptoms.  Otherwise patient can follow-up with both Dr. Ronnald Ramp and neurology as an outpatient.  Dr. and suggest starting patient on [facility] when she is discharged. Discussed with Dr. Sherry Ruffing in the emergency department at [facility].  Anticipate doing transfer for MRI.  Patient will go by private vehicle with a friend driving.  Return  precautions have been given.   Final Clinical Impressions(s) / ED Diagnoses   Final diagnoses:  Paresthesia    ED Discharge Orders    None       Julianne Rice, MD 10/06/17 1909

## 2017-10-06 NOTE — ED Notes (Signed)
Pt transported to MRI 

## 2017-10-06 NOTE — ED Triage Notes (Addendum)
Weakness nad numbness on rt side started yesterday and today as she ran felt numbness in rt foot , Drove herself here, states feels like it getting worse  Feel like it is harder to talk and face feels numb rt side  , on antibiotic for UTI, VAN is neg  , has hx of migraines ocular

## 2017-10-06 NOTE — ED Provider Notes (Signed)
42 year old female presents today in transfer from [place] for MRI.  Please see previous providers note for full H&P.  MRI ordered.  Patient in no acute distress at the time of evaluation.  Patient pending MRI at this time.   Okey Regal, PA-C 10/06/17 2111    Margette Fast, MD 10/06/17 2318

## 2017-10-06 NOTE — ED Notes (Signed)
Report to Lennette Bihari, Agricultural consultant; pt will be transferred via POV.

## 2017-10-06 NOTE — ED Notes (Signed)
ED Provider at bedside. 

## 2017-10-06 NOTE — ED Notes (Addendum)
Nurse first-Pt with neuro sx that she woke with yesterday am-NAD-steady gait

## 2017-10-06 NOTE — ED Provider Notes (Signed)
Patient with right sided upper and lower extremity weakness as well as some difficulty with word finding, but no true dysarthria.  Patient was transferred to [facility] for MRI brain.  On physical exam, the patient has significant right upper extremity weakness when compared to the left, no slurred speech, no facial droop.  Discussed case with Dr. Lorraine Lax, who suspects complex migraine, but recommends vascular imaging of the head and neck.  He will see the patient.  CT angiograms of head and neck are negative.  Will treat for complex migraine and reassess.  Patient's headache has resolved.  Her strength is normal.  Likely complex migraine.  Seen by neurology, who agrees with the plan.      Montine Circle, PA-C 10/07/17 1751    Orpah Greek, MD 10/07/17 [phone]

## 2017-10-07 ENCOUNTER — Emergency Department (HOSPITAL_COMMUNITY): Payer: Managed Care, Other (non HMO)

## 2017-10-07 DIAGNOSIS — R202 Paresthesia of skin: Secondary | ICD-10-CM | POA: Diagnosis not present

## 2017-10-07 MED ORDER — KETOROLAC TROMETHAMINE 30 MG/ML IJ SOLN
30.0000 mg | Freq: Once | INTRAMUSCULAR | Status: AC
  Administered 2017-10-07: 30 mg via INTRAVENOUS
  Filled 2017-10-07: qty 1

## 2017-10-07 MED ORDER — DIPHENHYDRAMINE HCL 50 MG/ML IJ SOLN
25.0000 mg | Freq: Once | INTRAMUSCULAR | Status: AC
  Administered 2017-10-07: 25 mg via INTRAVENOUS
  Filled 2017-10-07: qty 1

## 2017-10-07 MED ORDER — METOCLOPRAMIDE HCL 5 MG/ML IJ SOLN
10.0000 mg | Freq: Once | INTRAMUSCULAR | Status: AC
  Administered 2017-10-07: 10 mg via INTRAVENOUS
  Filled 2017-10-07: qty 2

## 2017-10-07 NOTE — Consult Note (Signed)
Neurology Consultation Reason for Consult:  R side weakness/word finding difficuly Referring Physician: Montine Circle PAC    HPI: Miranda [name] is a 42 y.o. female who presents to [facility]  Emergency room transfer from [facility] ER for numbness and stiffness of the right arm, right leg and some difficulty with word finding. Her symptoms began on Monday where she noticed that she had difficulty using her right hand and it felt stiff. She also noticed her leg felt weaker the following day. She also has been having headache since Monday. Today morning the patient also noticed she had difficulty getting the right words and noticed her speech was slow and came to the emergency room. She denies dysarthria or facial droop.She was transferred to [facility] ER to get a MRI brain. She continues to have symptoms of right hand stiffness even now.  She does not have any history of blood clots. She does have a history of migraines and was diagnosed ocular migraine after a transient episode of impaired peripheral vision. Headache frequency is once a month.Denies photophobia, nausea, vomiting. 1 history of miscarriage. Not on OCPs. No family history fo stroke in young age.    ROS: A 14 point ROS was performed and is negative except as noted in the HPI.   Past Medical History:  Diagnosis Date  . Anxiety   . Depression   . Migraines   . PONV (postoperative nausea and vomiting)      No family history on file.   Social History:  reports that  has never smoked. she has never used smokeless tobacco. She reports that she drinks alcohol. She reports that she does not use drugs.   Exam: Current vital signs: BP 115/74   Pulse 76   Temp 98.4 F (36.9 C)   Resp 16   Ht 5\' 7"  (1.702 m)   Wt 68 kg (150 lb)   LMP 09/16/2017   SpO2 100%   Breastfeeding? Unknown   BMI 23.49 kg/m  Vital signs in last 24 hours: Temp:  [98.4 F (36.9 C)] 98.4 F (36.9 C) (03/13 2239) Pulse Rate:  [62-86] 76 (03/14  0106) Resp:  [14-22] 16 (03/14 0106) BP: (112-137)/(74-99) 115/74 (03/14 0106) SpO2:  [98 %-100 %] 100 % (03/14 0106) Weight:  [68 kg (150 lb)] 68 kg (150 lb) (03/13 1424)   Physical Exam  Constitutional: Appears well-developed and well-nourished.  Psych: Affect appropriate to situation Eyes: No scleral injection HENT: No OP obstrucion Head: Normocephalic.  Cardiovascular: Normal rate and regular rhythm.  Respiratory: Effort normal, non-labored breathing GI: Soft.  No distension. There is no tenderness.  Skin: WDI  Neuro: Mental Status: Patient is awake, alert, oriented to person, place, month, year, and situation. Patient is able to give a clear and coherent history. No signs of aphasia or neglect Cranial Nerves: II: Visual Fields are full. Pupils are equal, round, and reactive to light.   III,IV, VI: EOMI without ptosis or diploplia.  V: Facial sensation is symmetric to temperature VII: Facial movement is symmetric.  VIII: hearing is intact to voice X: Uvula elevates symmetrically XI: Shoulder shrug is symmetric. XII: tongue is midline without atrophy or fasciculations.  Motor: Tone is normal. Bulk is normal. 5/5 strength was present in all four extremities.  Sensory: Sensation is symmetric to light touch and temperature in the arms and legs. Deep Tendon Reflexes: 2+ and symmetric in the biceps and patellae.  Plantars: Toes are downgoing bilaterally.  Cerebellar: FNF and HKS are  intact bilaterally    I have reviewed labs in epic and the results pertinent to this consultation are:   I have reviewed the images obtained: Mri brain and CTA head and neck   ASSESSMENT AND PLAN Return female with history of migraines presents to the ER with three-day history of headache, feeling of right hand and arm stiffness and weakness, right leg weakness and word finding difficulty.MR brain is negative for any acute stroke despite her currently having symptoms and therefore not  consistent with TIA. Vascular imaging also appears to be normal. Presentation favors complicated migraibe.    Complicated migraine  Recommend migraine cocktail iN ER Discussed option of starting nortriptyline, follow up with Neurology as outpatient for migraines.    Karena Addison Laronica Bhagat MD [facility] [phone]  If 7pm to 7am, please call on call as listed on AMION.

## 2017-10-07 NOTE — ED Notes (Signed)
Patient transported to CT 

## 2017-10-11 ENCOUNTER — Ambulatory Visit (INDEPENDENT_AMBULATORY_CARE_PROVIDER_SITE_OTHER): Payer: Managed Care, Other (non HMO) | Admitting: Diagnostic Neuroimaging

## 2017-10-11 ENCOUNTER — Encounter: Payer: Self-pay | Admitting: Diagnostic Neuroimaging

## 2017-10-11 VITALS — BP 120/76 | HR 84 | Ht 67.0 in | Wt 159.8 lb

## 2017-10-11 DIAGNOSIS — G43109 Migraine with aura, not intractable, without status migrainosus: Secondary | ICD-10-CM | POA: Diagnosis not present

## 2017-10-11 MED ORDER — TOPIRAMATE 25 MG PO TABS: 25.0000 mg | ORAL_TABLET | Freq: Two times a day (BID) | ORAL | 6 refills | Status: DC

## 2017-10-11 NOTE — Patient Instructions (Addendum)
-   start topiramate 25mg  at bedtime x 2 weeks; then increase to twice a day - drink plenty of water   -To prevent or relieve headaches, try the following:  Cool Compress. Lie down and place a cool compress on your head.   Avoid headache triggers. If certain foods or odors seem to have triggered your migraines in the past, avoid them. A headache diary might help you identify triggers.   Include physical activity in your daily routine.   Manage stress. Find healthy ways to cope with the stressors, such as delegating tasks on your to-do list.   Practice relaxation techniques. Try deep breathing, yoga, massage and visualization.   Eat regularly. Eating regularly scheduled meals and maintaining a healthy diet might help prevent headaches. Also, drink plenty of fluids.   Follow a regular sleep schedule. Sleep deprivation might contribute to headaches  Consider biofeedback. With this mind-body technique, you learn to control certain bodily functions - such as muscle tension, heart rate and blood pressure - to prevent headaches or reduce headache pain.

## 2017-10-11 NOTE — Progress Notes (Signed)
[facility]  PATIENT: Miranda [name] DOB: 23-Jan-1976  REFERRING CLINICIAN: ER  HISTORY FROM: patient and chart review  REASON FOR VISIT: new consult    HISTORICAL  CHIEF COMPLAINT:  Chief Complaint  Patient presents with  . NP ED referral  . Paresthesias,  Migraines.    pt in ED for R sided numbness, work up done, complex migriaine. 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     HISTORY OF PRESENT ILLNESS:   42 year old female here for evaluation of complicated migraine.  10/04/17, patient noticed some numbness on her right hand.  10/06/17 patient noticed some numbness in the right foot.  She also noted some facial numbness, word finding difficulty.  Patient went to the emergency room for evaluation.  MRI of the brain and CTA of head and neck were unremarkable.  Patient then developed some headache, especially around her left eye.  She had some blurred vision.  Patient was diagnosed with complicated migraine.  Since that time symptoms have gradually improved.  She still has some numbness and tingling on the right side.  She still feels some brain fog and fatigue.  Patient has history of intermittent headaches, 10/month with generalized pressure throbbing sensation, nausea, photophobia, since age 30 years old.  These are typically triggered by menstrual cycle or lack of sleep.  Patient also has had "exercise-induced ocular migraine" for several years.  Following significant physical activity and exertion she may feel headache, decreased peripheral vision, and see spots and floaters.  Patient did change her diet in January 2019 to ketogenic diet.  Initially she had some slight increased headaches but has stabilized since that time.  Patient has been on sertraline 50 mg daily for mild depression and anxiety symptoms, stable.   REVIEW OF SYSTEMS: Full 14 system review of systems performed and negative with exception of: Blurred vision double vision ringing in  ears numbness weakness.  ALLERGIES: No Known Allergies  HOME MEDICATIONS: Outpatient Medications Prior to Visit  Medication Sig Dispense Refill  . ibuprofen (ADVIL) 200 MG tablet Take 4 tablets (800 mg total) by mouth every 8 (eight) hours as needed. 30 tablet 1  . sertraline (ZOLOFT) 50 MG tablet Take 50 mg by mouth daily.    Marland Kitchen amoxicillin (AMOXIL) 250 MG capsule Take 250 mg by mouth 3 (three) times daily.  0  . oxycodone-acetaminophen (PERCOCET) 2.5-325 MG tablet Take 1 tablet by mouth every 4 (four) hours as needed for pain. (Patient not taking: Reported on 10/06/2017) 20 tablet 0   No facility-administered medications prior to visit.     PAST MEDICAL HISTORY: Past Medical History:  Diagnosis Date  . Anxiety   . Depression   . Migraines   . PONV (postoperative nausea and vomiting)     PAST SURGICAL HISTORY: Past Surgical History:  Procedure Laterality Date  . DILATION AND EVACUATION N/A 09/11/2016   Procedure: DILATATION AND EVACUATION;  Surgeon: Molli Posey, MD;  Location: [facility];  Service: Gynecology;  Laterality: N/A;  . SHOULDER ARTHROSCOPY Right   . SINUS SURGERY WITH INSTATRAK      FAMILY HISTORY: Family History  Problem Relation Age of Onset  . Parkinson's disease Father     SOCIAL HISTORY:  Social History   Socioeconomic History  . Marital status: Married    Spouse name: Not on file  . Number of children: Not on file  . Years of education: Not on file  . Highest education level: Not on file  Social  Needs  . Financial resource strain: Not on file  . Food insecurity - worry: Not on file  . Food insecurity - inability: Not on file  . Transportation needs - medical: Not on file  . Transportation needs - non-medical: Not on file  Occupational History  . Not on file  Tobacco Use  . Smoking status: Never Smoker  . Smokeless tobacco: Never Used  Substance and Sexual Activity  . Alcohol use: Yes    Comment: none since pregnancy  . Drug use: No  .  Sexual activity: Not on file  Other Topics Concern  . Not on file  Social History Narrative   Lives home with husband and children.  Education Bachelors Degree.  2 children.  She is self employed as a Futures trader. Caffeine 2 cups daily.     PHYSICAL EXAM   GENERAL EXAM/CONSTITUTIONAL: Vitals:  Vitals:   10/11/17 0856  BP: 120/76  Pulse: 84  Weight: 159 lb 12.8 oz (72.5 kg)  Height: 5\' 7"  (1.702 m)     Body mass index is 25.03 kg/m.  Visual Acuity Screening   Right eye Left eye Both eyes  Without correction: 20/20 20/30   With correction:        Patient is in no distress; well developed, nourished and groomed; neck is supple  CARDIOVASCULAR:  Examination of carotid arteries is normal; no carotid bruits  Regular rate and rhythm, no murmurs  Examination of peripheral vascular system by observation and palpation is normal  EYES:  Ophthalmoscopic exam of optic discs and posterior segments is normal; no papilledema or hemorrhages  MUSCULOSKELETAL:  Gait, strength, tone, movements noted in Neurologic exam below  NEUROLOGIC: MENTAL STATUS:  No flowsheet data found.  awake, alert, oriented to person, place and time  recent and remote memory intact  normal attention and concentration  language fluent, comprehension intact, naming intact,   fund of knowledge appropriate  CRANIAL NERVE:   2nd - no papilledema on fundoscopic exam  2nd, 3rd, 4th, 6th - pupils equal and reactive to light, visual fields full to confrontation, extraocular muscles intact, no nystagmus  5th - facial sensation symmetric  7th - facial strength symmetric  8th - hearing intact  9th - palate elevates symmetrically, uvula midline  11th - shoulder shrug symmetric  12th - tongue protrusion midline  MOTOR:   normal bulk and tone, full strength in the BUE, BLE  SENSORY:   normal and symmetric to light touch, temperature, vibration  COORDINATION:    finger-nose-finger, fine finger movements normal  REFLEXES:   deep tendon reflexes present and symmetric  GAIT/STATION:   narrow based gait; able to walk tandem; romberg is negative    DIAGNOSTIC DATA (LABS, IMAGING, TESTING) - I reviewed patient records, labs, notes, testing and imaging myself where available.  Lab Results  Component Value Date   WBC 7.4 10/06/2017   HGB 13.5 10/06/2017   HCT 40.1 10/06/2017   MCV 95.2 10/06/2017   PLT 229 10/06/2017      Component Value Date/Time   NA 139 10/06/2017 1732   K 3.9 10/06/2017 1732   CL 104 10/06/2017 1732   CO2 24 10/06/2017 1732   GLUCOSE 86 10/06/2017 1732   BUN 12 10/06/2017 1732   CREATININE 0.69 10/06/2017 1732   CALCIUM 9.3 10/06/2017 1732   PROT 7.0 10/06/2017 1732   ALBUMIN 4.8 10/06/2017 1732   AST 41 10/06/2017 1732   ALT 48 10/06/2017 1732   ALKPHOS 31 (L) 10/06/2017 1732  BILITOT 0.9 10/06/2017 1732   GFRNONAA >60 10/06/2017 1732   GFRAA >60 10/06/2017 1732   No results found for: CHOL, HDL, LDLCALC, LDLDIRECT, TRIG, CHOLHDL No results found for: HGBA1C No results found for: VITAMINB12 No results found for: TSH   10/06/17 MRI brain  - Normal brain MRI.  10/07/17 CTA head / neck - Normal CTA of the head and neck.     ASSESSMENT AND PLAN  42 y.o. year old female here with new onset right-sided numbness, vision changes, headache, word finding difficulties, most consistent with complicated migraine.  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  Will try low-dose migraine preventative medication.  Localization:  Dx:  1. Complicated migraine      PLAN:  MIGRAINE PREVENTION - topiramate 25mg  at bedtime x 2 weeks; then increase to twice a day; drink plenty of water  MIGRAINE RESCUE  - excedrin migraine as needed  Meds ordered this encounter  Medications  . topiramate (TOPAMAX) 25 MG tablet    Sig: Take 1 tablet (25 mg  total) by mouth 2 (two) times daily.    Dispense:  60 tablet    Refill:  6   Return in about 4 months (around 02/10/2018).    Penni Bombard, MD 1/60/1093, 2:35 AM Certified in Neurology, Neurophysiology and Neuroimaging  [facility] [address], [place], [place] [address] [phone]

## 2017-11-09 ENCOUNTER — Other Ambulatory Visit: Payer: Self-pay | Admitting: Obstetrics and Gynecology

## 2017-11-09 DIAGNOSIS — R928 Other abnormal and inconclusive findings on diagnostic imaging of breast: Secondary | ICD-10-CM

## 2017-11-10 ENCOUNTER — Ambulatory Visit
Admission: RE | Admit: 2017-11-10 | Discharge: 2017-11-10 | Disposition: A | Discharge status: Home / Self Care | Payer: Managed Care, Other (non HMO) | Source: Ambulatory Visit | Attending: Obstetrics and Gynecology | Admitting: Obstetrics and Gynecology

## 2017-11-10 ENCOUNTER — Other Ambulatory Visit: Payer: Self-pay | Admitting: Obstetrics and Gynecology

## 2017-11-10 ENCOUNTER — Ambulatory Visit
Admission: RE | Admit: 2017-11-10 | Discharge: 2017-11-10 | Disposition: A | Discharge status: Home / Self Care | Payer: BLUE CROSS/BLUE SHIELD | Source: Ambulatory Visit | Attending: Obstetrics and Gynecology | Admitting: Obstetrics and Gynecology

## 2017-11-10 DIAGNOSIS — R928 Other abnormal and inconclusive findings on diagnostic imaging of breast: Secondary | ICD-10-CM

## 2017-11-10 DIAGNOSIS — N6489 Other specified disorders of breast: Secondary | ICD-10-CM

## 2017-11-15 ENCOUNTER — Ambulatory Visit
Admission: RE | Admit: 2017-11-15 | Discharge: 2017-11-15 | Disposition: A | Discharge status: Home / Self Care | Payer: BLUE CROSS/BLUE SHIELD | Source: Ambulatory Visit | Attending: Obstetrics and Gynecology | Admitting: Obstetrics and Gynecology

## 2017-11-15 DIAGNOSIS — N6489 Other specified disorders of breast: Secondary | ICD-10-CM

## 2017-12-01 ENCOUNTER — Other Ambulatory Visit: Payer: Self-pay | Admitting: General Surgery

## 2017-12-01 DIAGNOSIS — R928 Other abnormal and inconclusive findings on diagnostic imaging of breast: Secondary | ICD-10-CM

## 2017-12-02 ENCOUNTER — Other Ambulatory Visit: Payer: Self-pay

## 2017-12-02 ENCOUNTER — Encounter (HOSPITAL_BASED_OUTPATIENT_CLINIC_OR_DEPARTMENT_OTHER): Payer: Self-pay | Admitting: *Deleted

## 2017-12-03 ENCOUNTER — Ambulatory Visit
Admission: RE | Admit: 2017-12-03 | Discharge: 2017-12-03 | Disposition: A | Discharge status: Home / Self Care | Payer: BLUE CROSS/BLUE SHIELD | Source: Ambulatory Visit | Attending: General Surgery | Admitting: General Surgery

## 2017-12-03 DIAGNOSIS — R928 Other abnormal and inconclusive findings on diagnostic imaging of breast: Secondary | ICD-10-CM

## 2017-12-03 NOTE — Progress Notes (Signed)
Pt in to pick up Ensure pre op drink, instructions reviewed. 

## 2017-12-06 ENCOUNTER — Encounter (HOSPITAL_COMMUNITY): Payer: Self-pay

## 2017-12-06 ENCOUNTER — Other Ambulatory Visit: Payer: Self-pay | Admitting: General Surgery

## 2017-12-06 DIAGNOSIS — R928 Other abnormal and inconclusive findings on diagnostic imaging of breast: Secondary | ICD-10-CM

## 2017-12-07 ENCOUNTER — Ambulatory Visit (HOSPITAL_COMMUNITY)
Admission: RE | Admit: 2017-12-07 | Discharge: 2017-12-07 | Disposition: A | Discharge status: Home / Self Care | Payer: BLUE CROSS/BLUE SHIELD | Source: Ambulatory Visit | Attending: General Surgery | Admitting: General Surgery

## 2017-12-07 ENCOUNTER — Ambulatory Visit
Admission: RE | Admit: 2017-12-07 | Discharge: 2017-12-07 | Disposition: A | Discharge status: Home / Self Care | Payer: BLUE CROSS/BLUE SHIELD | Source: Ambulatory Visit | Attending: General Surgery | Admitting: General Surgery

## 2017-12-07 ENCOUNTER — Ambulatory Visit (HOSPITAL_COMMUNITY): Payer: BLUE CROSS/BLUE SHIELD | Admitting: Certified Registered Nurse Anesthetist

## 2017-12-07 ENCOUNTER — Encounter (HOSPITAL_COMMUNITY)
Admission: RE | Disposition: A | Discharge status: Home / Self Care | Payer: Self-pay | Source: Ambulatory Visit | Attending: General Surgery

## 2017-12-07 ENCOUNTER — Ambulatory Visit: Admit: 2017-12-07 | Payer: BLUE CROSS/BLUE SHIELD

## 2017-12-07 ENCOUNTER — Ambulatory Visit (HOSPITAL_COMMUNITY): Payer: BLUE CROSS/BLUE SHIELD

## 2017-12-07 ENCOUNTER — Encounter (HOSPITAL_COMMUNITY): Payer: Self-pay | Admitting: Certified Registered Nurse Anesthetist

## 2017-12-07 DIAGNOSIS — N6011 Diffuse cystic mastopathy of right breast: Secondary | ICD-10-CM | POA: Diagnosis not present

## 2017-12-07 DIAGNOSIS — D241 Benign neoplasm of right breast: Secondary | ICD-10-CM | POA: Insufficient documentation

## 2017-12-07 DIAGNOSIS — Z803 Family history of malignant neoplasm of breast: Secondary | ICD-10-CM | POA: Insufficient documentation

## 2017-12-07 DIAGNOSIS — R928 Other abnormal and inconclusive findings on diagnostic imaging of breast: Secondary | ICD-10-CM

## 2017-12-07 DIAGNOSIS — Z800 Family history of malignant neoplasm of digestive organs: Secondary | ICD-10-CM | POA: Insufficient documentation

## 2017-12-07 DIAGNOSIS — Z8041 Family history of malignant neoplasm of ovary: Secondary | ICD-10-CM | POA: Insufficient documentation

## 2017-12-07 DIAGNOSIS — N6021 Fibroadenosis of right breast: Secondary | ICD-10-CM | POA: Diagnosis not present

## 2017-12-07 DIAGNOSIS — Z79899 Other long term (current) drug therapy: Secondary | ICD-10-CM | POA: Insufficient documentation

## 2017-12-07 DIAGNOSIS — Z419 Encounter for procedure for purposes other than remedying health state, unspecified: Secondary | ICD-10-CM

## 2017-12-07 DIAGNOSIS — N6312 Unspecified lump in the right breast, upper inner quadrant: Secondary | ICD-10-CM | POA: Diagnosis present

## 2017-12-07 HISTORY — PX: BREAST LUMPECTOMY WITH NEEDLE LOCALIZATION: SHX5759

## 2017-12-07 LAB — CBC
HEMATOCRIT: 42.5 % (ref 36.0–46.0)
HEMOGLOBIN: 14.3 g/dL (ref 12.0–15.0)
MCH: 32.3 pg (ref 26.0–34.0)
MCHC: 33.6 g/dL (ref 30.0–36.0)
MCV: 95.9 fL (ref 78.0–100.0)
Platelets: 306 10*3/uL (ref 150–400)
RBC: 4.43 MIL/uL (ref 3.87–5.11)
RDW: 12.1 % (ref 11.5–15.5)
WBC: 10.9 10*3/uL — AB (ref 4.0–10.5)

## 2017-12-07 LAB — POCT PREGNANCY, URINE: Preg Test, Ur: NEGATIVE

## 2017-12-07 SURGERY — BREAST LUMPECTOMY WITH NEEDLE LOCALIZATION
Anesthesia: General | Site: Breast | Laterality: Right

## 2017-12-07 MED ORDER — LACTATED RINGERS IV SOLN
INTRAVENOUS | Status: DC
  Administered 2017-12-07: 09:00:00 via INTRAVENOUS

## 2017-12-07 MED ORDER — DEXAMETHASONE SODIUM PHOSPHATE 4 MG/ML IJ SOLN
INTRAMUSCULAR | Status: DC | PRN
  Administered 2017-12-07: 5 mg via INTRAVENOUS

## 2017-12-07 MED ORDER — GABAPENTIN 300 MG PO CAPS: 300.0000 mg | ORAL_CAPSULE | Freq: Once | ORAL | Status: DC

## 2017-12-07 MED ORDER — HYDROMORPHONE HCL 1 MG/ML IJ SOLN
INTRAMUSCULAR | Status: AC
  Filled 2017-12-07: qty 0.5

## 2017-12-07 MED ORDER — PROPOFOL 10 MG/ML IV BOLUS
INTRAVENOUS | Status: AC
  Filled 2017-12-07: qty 20

## 2017-12-07 MED ORDER — GABAPENTIN 300 MG PO CAPS
ORAL_CAPSULE | ORAL | Status: AC
  Filled 2017-12-07: qty 1

## 2017-12-07 MED ORDER — BUPIVACAINE-EPINEPHRINE (PF) 0.25% -1:200000 IJ SOLN
INTRAMUSCULAR | Status: AC
  Filled 2017-12-07: qty 30

## 2017-12-07 MED ORDER — BUPIVACAINE-EPINEPHRINE 0.25% -1:200000 IJ SOLN
INTRAMUSCULAR | Status: DC | PRN
  Administered 2017-12-07: 10 mL

## 2017-12-07 MED ORDER — FENTANYL CITRATE (PF) 100 MCG/2ML IJ SOLN: 50.0000 ug | INTRAMUSCULAR | Status: DC | PRN

## 2017-12-07 MED ORDER — LIDOCAINE HCL (CARDIAC) PF 100 MG/5ML IV SOSY
PREFILLED_SYRINGE | INTRAVENOUS | Status: DC | PRN
  Administered 2017-12-07: 50 mg via INTRAVENOUS

## 2017-12-07 MED ORDER — MIDAZOLAM HCL 2 MG/2ML IJ SOLN
INTRAMUSCULAR | Status: AC
  Filled 2017-12-07: qty 2

## 2017-12-07 MED ORDER — ONDANSETRON HCL 4 MG/2ML IJ SOLN: 4.0000 mg | Freq: Once | INTRAMUSCULAR | Status: DC | PRN

## 2017-12-07 MED ORDER — HEMOSTATIC AGENTS (NO CHARGE) OPTIME
TOPICAL | Status: DC | PRN
  Administered 2017-12-07: 1 via TOPICAL

## 2017-12-07 MED ORDER — FENTANYL CITRATE (PF) 250 MCG/5ML IJ SOLN
INTRAMUSCULAR | Status: AC
Start: 2017-12-07 — End: ?
  Filled 2017-12-07: qty 5

## 2017-12-07 MED ORDER — ENSURE PRE-SURGERY PO LIQD
296.0000 mL | Freq: Once | ORAL | Status: DC
Start: 2017-12-08 — End: 2017-12-07

## 2017-12-07 MED ORDER — FENTANYL CITRATE (PF) 100 MCG/2ML IJ SOLN: 25.0000 ug | INTRAMUSCULAR | Status: DC | PRN

## 2017-12-07 MED ORDER — 0.9 % SODIUM CHLORIDE (POUR BTL) OPTIME
TOPICAL | Status: DC | PRN
  Administered 2017-12-07: 1000 mL

## 2017-12-07 MED ORDER — EPHEDRINE SULFATE 50 MG/ML IJ SOLN
INTRAMUSCULAR | Status: DC | PRN
  Administered 2017-12-07 (×3): 5 mg via INTRAVENOUS

## 2017-12-07 MED ORDER — OXYCODONE HCL 5 MG/5ML PO SOLN: 5.0000 mg | Freq: Once | ORAL | Status: DC | PRN

## 2017-12-07 MED ORDER — TRAMADOL HCL 50 MG PO TABS: 100.0000 mg | ORAL_TABLET | Freq: Four times a day (QID) | ORAL | 0 refills | Status: DC | PRN

## 2017-12-07 MED ORDER — FENTANYL CITRATE (PF) 250 MCG/5ML IJ SOLN
INTRAMUSCULAR | Status: DC | PRN
  Administered 2017-12-07 (×3): 25 ug via INTRAVENOUS

## 2017-12-07 MED ORDER — ONDANSETRON HCL 4 MG/2ML IJ SOLN
INTRAMUSCULAR | Status: DC | PRN
  Administered 2017-12-07: 4 mg via INTRAVENOUS

## 2017-12-07 MED ORDER — SCOPOLAMINE 1 MG/3DAYS TD PT72
1.0000 | MEDICATED_PATCH | Freq: Once | TRANSDERMAL | Status: DC | PRN
  Administered 2017-12-07: 1.5 mg via TRANSDERMAL
  Filled 2017-12-07: qty 1

## 2017-12-07 MED ORDER — GABAPENTIN 100 MG PO CAPS
100.0000 mg | ORAL_CAPSULE | ORAL | Status: DC
  Filled 2017-12-07: qty 1

## 2017-12-07 MED ORDER — MIDAZOLAM HCL 2 MG/2ML IJ SOLN: 1.0000 mg | INTRAMUSCULAR | Status: DC | PRN

## 2017-12-07 MED ORDER — MIDAZOLAM HCL 5 MG/5ML IJ SOLN
INTRAMUSCULAR | Status: DC | PRN
  Administered 2017-12-07: 2 mg via INTRAVENOUS

## 2017-12-07 MED ORDER — PROPOFOL 10 MG/ML IV BOLUS
INTRAVENOUS | Status: DC | PRN
  Administered 2017-12-07: 160 mg via INTRAVENOUS

## 2017-12-07 MED ORDER — KETOROLAC TROMETHAMINE 15 MG/ML IJ SOLN
15.0000 mg | INTRAMUSCULAR | Status: AC
  Administered 2017-12-07: 15 mg via INTRAVENOUS
  Filled 2017-12-07: qty 1

## 2017-12-07 MED ORDER — CEFAZOLIN SODIUM-DEXTROSE 2-4 GM/100ML-% IV SOLN
2.0000 g | INTRAVENOUS | Status: AC
  Administered 2017-12-07: 2 g via INTRAVENOUS
  Filled 2017-12-07: qty 100

## 2017-12-07 MED ORDER — LACTATED RINGERS IV SOLN
INTRAVENOUS | Status: DC | PRN
  Administered 2017-12-07: 11:00:00 via INTRAVENOUS

## 2017-12-07 MED ORDER — ACETAMINOPHEN 500 MG PO TABS
1000.0000 mg | ORAL_TABLET | ORAL | Status: AC
  Administered 2017-12-07: 1000 mg via ORAL
  Filled 2017-12-07: qty 2

## 2017-12-07 MED ORDER — OXYCODONE HCL 5 MG PO TABS: 5.0000 mg | ORAL_TABLET | Freq: Once | ORAL | Status: DC | PRN

## 2017-12-07 SURGICAL SUPPLY — 48 items
APPLIER CLIP 9.375 MED OPEN (MISCELLANEOUS)
BINDER BREAST LRG (GAUZE/BANDAGES/DRESSINGS) ×2 IMPLANT
BINDER BREAST XLRG (GAUZE/BANDAGES/DRESSINGS) IMPLANT
BLADE SURG 15 STRL LF DISP TIS (BLADE) ×1 IMPLANT
BLADE SURG 15 STRL SS (BLADE) ×1
CANISTER SUCT 3000ML PPV (MISCELLANEOUS) ×2 IMPLANT
CHLORAPREP W/TINT 26ML (MISCELLANEOUS) ×2 IMPLANT
CLIP APPLIE 9.375 MED OPEN (MISCELLANEOUS) IMPLANT
CLSR STERI-STRIP ANTIMIC 1/2X4 (GAUZE/BANDAGES/DRESSINGS) ×2 IMPLANT
CONT SPEC 4OZ CLIKSEAL STRL BL (MISCELLANEOUS) ×2 IMPLANT
COVER PROBE W GEL 5X96 (DRAPES) ×2 IMPLANT
COVER SURGICAL LIGHT HANDLE (MISCELLANEOUS) ×2 IMPLANT
DERMABOND ADVANCED (GAUZE/BANDAGES/DRESSINGS) ×1
DERMABOND ADVANCED .7 DNX12 (GAUZE/BANDAGES/DRESSINGS) ×1 IMPLANT
DEVICE DUBIN SPECIMEN MAMMOGRA (MISCELLANEOUS) ×2 IMPLANT
DRAPE C-ARM 42X72 X-RAY (DRAPES) ×2 IMPLANT
DRAPE CHEST BREAST 15X10 FENES (DRAPES) ×2 IMPLANT
DRAPE UTILITY XL STRL (DRAPES) ×2 IMPLANT
ELECT COATED BLADE 2.86 ST (ELECTRODE) ×2 IMPLANT
ELECT REM PT RETURN 9FT ADLT (ELECTROSURGICAL) ×2
ELECTRODE REM PT RTRN 9FT ADLT (ELECTROSURGICAL) ×1 IMPLANT
GLOVE BIO SURGEON STRL SZ7 (GLOVE) ×6 IMPLANT
GLOVE BIOGEL PI IND STRL 7.5 (GLOVE) ×2 IMPLANT
GLOVE BIOGEL PI INDICATOR 7.5 (GLOVE) ×2
GOWN STRL REUS W/ TWL LRG LVL3 (GOWN DISPOSABLE) ×2 IMPLANT
GOWN STRL REUS W/TWL LRG LVL3 (GOWN DISPOSABLE) ×2
HEMOSTAT ARISTA ABSORB 3G PWDR (MISCELLANEOUS) ×2 IMPLANT
ILLUMINATOR WAVEGUIDE N/F (MISCELLANEOUS) IMPLANT
KIT BASIN OR (CUSTOM PROCEDURE TRAY) ×2 IMPLANT
KIT MARKER MARGIN INK (KITS) ×2 IMPLANT
NEEDLE HYPO 25GX1X1/2 BEV (NEEDLE) ×2 IMPLANT
NS IRRIG 1000ML POUR BTL (IV SOLUTION) ×2 IMPLANT
PACK SURGICAL SETUP 50X90 (CUSTOM PROCEDURE TRAY) ×2 IMPLANT
PENCIL BUTTON HOLSTER BLD 10FT (ELECTRODE) ×2 IMPLANT
SPONGE LAP 18X18 X RAY DECT (DISPOSABLE) ×2 IMPLANT
STRIP CLOSURE SKIN 1/2X4 (GAUZE/BANDAGES/DRESSINGS) ×2 IMPLANT
SUT MNCRL AB 4-0 PS2 18 (SUTURE) ×2 IMPLANT
SUT MON AB 5-0 PS2 18 (SUTURE) ×2 IMPLANT
SUT SILK 2 0 SH (SUTURE) IMPLANT
SUT VIC AB 2-0 SH 27 (SUTURE) ×3
SUT VIC AB 2-0 SH 27XBRD (SUTURE) ×3 IMPLANT
SUT VIC AB 3-0 SH 27 (SUTURE) ×1
SUT VIC AB 3-0 SH 27X BRD (SUTURE) ×1 IMPLANT
SYR BULB 3OZ (MISCELLANEOUS) ×2 IMPLANT
SYR CONTROL 10ML LL (SYRINGE) ×2 IMPLANT
TOWEL OR 17X24 6PK STRL BLUE (TOWEL DISPOSABLE) ×2 IMPLANT
TUBE CONNECTING 12X1/4 (SUCTIONS) ×2 IMPLANT
YANKAUER SUCT BULB TIP NO VENT (SUCTIONS) ×2 IMPLANT

## 2017-12-07 NOTE — Anesthesia Preprocedure Evaluation (Signed)
Anesthesia Evaluation  Patient identified by MRN, date of birth, ID band Patient awake    Reviewed: Allergy & Precautions, NPO status , Patient's Chart, lab work & pertinent test results  Airway Mallampati: II  TM Distance: >3 FB Neck ROM: Full    Dental  (+) Teeth Intact, Dental Advisory Given   Pulmonary    breath sounds clear to auscultation       Cardiovascular  Rhythm:Regular Rate:Normal     Neuro/Psych    GI/Hepatic   Endo/Other    Renal/GU      Musculoskeletal   Abdominal   Peds  Hematology   Anesthesia Other Findings   Reproductive/Obstetrics                             Anesthesia Physical Anesthesia Plan  ASA: I  Anesthesia Plan: General   Post-op Pain Management:    Induction: Intravenous  PONV Risk Score and Plan: Ondansetron, Dexamethasone and Scopolamine patch - Pre-op  Airway Management Planned: LMA  Additional Equipment:   Intra-op Plan:   Post-operative Plan:   Informed Consent: I have reviewed the patients History and Physical, chart, labs and discussed the procedure including the risks, benefits and alternatives for the proposed anesthesia with the patient or authorized representative who has indicated his/her understanding and acceptance.   Dental advisory given  Plan Discussed with: CRNA and Anesthesiologist  Anesthesia Plan Comments:         Anesthesia Quick Evaluation

## 2017-12-07 NOTE — Op Note (Addendum)
Preoperative diagnosis: Right breast mammographic lesion with discordant biopsy Postoperative diagnosis: same as above Procedure:Right breast seed and wire guided excisional biopsy Surgeon: Dr Serita Grammes [id number] Anes: general  Specimens  1.Rightbreast tissue marked with paint 2. Additional inferior tissue Drains none Sponge count correct Dispo to pacu stable  Indications: This is a60 yof who haselevated risk of breast cancer by T-C calculator.  She has d density breasts on mammogram.  There was architectural distortion and this underwent biopsy.  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  The seed was placed a couple cm away. We elected to also place a wire.    Procedure: After informed consent was obtained the patient was taken to the operating room.  She was given antibiotics. Sequential compression devices were on her legs. She was then placed under general anesthesia with an LMA. Then she was prepped and draped in the standard sterile surgical fashion. Surgical timeout was then performed.  I then located the seed in the central deep right breast. I infiltrated marcaineand then made a periareolar incision to hide the scar.I then used the neoprobe to remove the seed all the way down to muscle. I went medial and removed the wire.  The clip was at proximal fat portion of wire and all this was removed.   I marked this with paint. MM confirmed removal of seed and thewire. The clip was not present. I did remove some additional tissue. The wire was in middle of tissue. I did a fluoro study and there was no clip. I felt like I had removed the area localized adequately at this point and will wait for pathology. I then obtained hemostasis. This was marked as above.I closed with with 2-0 vicryl to approximate breast tissue. The skin was closed with 3-0 vicryl and 5-0 monocryl. Glue and steristrips were placed. She was extubated and transferred to pacu stable.

## 2017-12-07 NOTE — Discharge Instructions (Signed)
[facility] °Office Phone Number [phone] °POST OP INSTRUCTIONS ° °Always review your discharge instruction sheet given to you by the facility where your surgery was performed. ° °IF YOU HAVE DISABILITY OR FAMILY LEAVE FORMS, YOU MUST BRING THEM TO THE OFFICE FOR PROCESSING.  DO NOT GIVE THEM TO YOUR DOCTOR. ° °1. A prescription for pain medication may be given to you upon discharge.  Take your pain medication as prescribed, if needed.  If narcotic pain medicine is not needed, then you may take acetaminophen (Tylenol), naprosyn (Alleve) or ibuprofen (Advil) as needed. °2. Take your usually prescribed medications unless otherwise directed °3. If you need a refill on your pain medication, please contact your pharmacy.  They will contact our office to request authorization.  Prescriptions will not be filled after 5pm or on week-ends. °4. You should eat very light the first 24 hours after surgery, such as soup, crackers, pudding, etc.  Resume your normal diet the day after surgery. °5. Most patients will experience some swelling and bruising in the breast.  Ice packs and a good support bra will help.  Wear the breast binder provided or a sports bra for 72 hours day and night.  After that wear a sports bra during the day until you return to the office. Swelling and bruising can take several days to resolve.  °6. It is common to experience some constipation if taking pain medication after surgery.  Increasing fluid intake and taking a stool softener will usually help or prevent this problem from occurring.  A mild laxative (Milk of Magnesia or Miralax) should be taken according to package directions if there are no bowel movements after 48 hours. °7. Unless discharge instructions indicate otherwise, you may remove your bandages 48 hours after surgery and you may shower at that time.  You may have steri-strips (small skin tapes) in place directly over the incision.  These strips should be left on the  skin for 7-10 days and will come off on their own.  If your surgeon used skin glue on the incision, you may shower in 24 hours.  The glue will flake off over the next 2-3 weeks.  Any sutures or staples will be removed at the office during your follow-up visit. °8. ACTIVITIES:  You may resume regular daily activities (gradually increasing) beginning the next day.  Wearing a good support bra or sports bra minimizes pain and swelling.  You may have sexual intercourse when it is comfortable. °a. You may drive when you no longer are taking prescription pain medication, you can comfortably wear a seatbelt, and you can safely maneuver your car and apply brakes. °b. RETURN TO WORK:  ______________________________________________________________________________________ °9. You should see your doctor in the office for a follow-up appointment approximately two weeks after your surgery.  Your doctor’s nurse will typically make your follow-up appointment when she calls you with your pathology report.  Expect your pathology report 3-4 business days after your surgery.  You may call to check if you do not hear from us after three days. °10. OTHER INSTRUCTIONS: _______________________________________________________________________________________________ _____________________________________________________________________________________________________________________________________ °_____________________________________________________________________________________________________________________________________ °_____________________________________________________________________________________________________________________________________ ° °WHEN TO CALL DR Ulani Degrasse: °1. Fever over 101.0 °2. Nausea and/or vomiting. °3. Extreme swelling or bruising. °4. Continued bleeding from incision. °5. Increased pain, redness, or drainage from the incision. ° °The clinic staff is available to answer your questions during regular  business hours.  Please don’t hesitate to call and ask to speak to one of the nurses for clinical concerns.  If you   have a medical emergency, go to the nearest emergency room or call 911.  A surgeon from [facility] is always on call at the hospital. ° °For further questions, please visit [email or web address] ° °

## 2017-12-07 NOTE — Anesthesia Postprocedure Evaluation (Signed)
Anesthesia Post Note  Patient: Miranda [name]  Procedure(s) Performed: BREAST LUMPECTOMY WITH NEEDLE LOCALIZATION AND RADIOACTIVE SEED (Right Breast)     Patient location during evaluation: PACU Anesthesia Type: General Level of consciousness: awake and alert Pain management: pain level controlled Vital Signs Assessment: post-procedure vital signs reviewed and stable Respiratory status: spontaneous breathing, nonlabored ventilation, respiratory function stable and patient connected to nasal cannula oxygen Cardiovascular status: blood pressure returned to baseline and stable Postop Assessment: no apparent nausea or vomiting Anesthetic complications: no    Last Vitals:  Vitals:   12/07/17 1235 12/07/17 1250  BP: 125/81 129/86  Pulse: 79 86  Resp: 11 12  Temp: 36.7 C   SpO2: 100% 97%    Last Pain:  Vitals:   12/07/17 1235  TempSrc:   PainSc: 0-No pain                 Darleen Moffitt COKER

## 2017-12-07 NOTE — Transfer of Care (Signed)
Immediate Anesthesia Transfer of Care Note  Patient: Miranda [name]  Procedure(s) Performed: BREAST LUMPECTOMY WITH NEEDLE LOCALIZATION AND RADIOACTIVE SEED (Right Breast)  Patient Location: PACU  Anesthesia Type:General  Level of Consciousness: awake and patient cooperative  Airway & Oxygen Therapy: Patient Spontanous Breathing  Post-op Assessment: Report given to RN and Post -op Vital signs reviewed and stable  Post vital signs: Reviewed and stable  Last Vitals:  Vitals Value Taken Time  BP 125/81 12/07/2017 12:34 PM  Temp 36.7 C 12/07/2017 12:35 PM  Pulse 74 12/07/2017 12:35 PM  Resp 12 12/07/2017 12:35 PM  SpO2 97 % 12/07/2017 12:35 PM  Vitals shown include unvalidated device data.  Last Pain:  Vitals:   12/07/17 0916  TempSrc:   PainSc: 0-No pain      Patients Stated Pain Goal: 2 (32/35/57 3220)  Complications: No apparent anesthesia complications

## 2017-12-07 NOTE — H&P (Signed)
42 yof referred by Dr Miquel Dunn for right breast mammographic distortion. she has significant family history of breast cancer in mom at age 43 and a mgm with ovarian cancer. her mother was tested and is negative for brca. she has recently had testing done at [facility] and is pending. she has a prior right breast core biopsy that was benign. she underwent screening mm with d density breasts. there was a possible architectural distortion in retroareolar right breast. this was confirmed by additional views and found to be in UIQ. US shows no correlate, just dense tissue. right axillary nodes negative. she had core biopsy that is fcc and is discordant. she has no mass or dc. she is here to discuss options.    Past Surgical History (Miranda [name], [place]; 12/01/2017 9:56 AM) Breast Biopsy  Right. Oral Surgery  Shoulder Surgery  Right.  Diagnostic Studies History (Miranda [name], [place]; 12/01/2017 9:56 AM) Colonoscopy  >10 years ago Mammogram  within last year Pap Smear  1-5 years ago  Allergies (Miranda [name], [place]; 12/01/2017 9:57 AM) No Known Drug Allergies [12/01/2017]: Allergies Reconciled   Medication History (Miranda [name], [place]; 12/01/2017 9:58 AM) ALPRAZolam (0.5MG Tablet, Oral) Active. Topiramate (25MG Tablet, Oral) Active. Medications Reconciled  Social History (Miranda [name], [place]; 12/01/2017 9:56 AM) Alcohol use  Occasional alcohol use. Caffeine use  Coffee. No drug use  Tobacco use  Never smoker.  Family History (Miranda [name], [place]; 12/01/2017 9:56 AM) Breast Cancer  Mother. Colon Cancer  Father. Ovarian Cancer  Mother.  Pregnancy / Birth History (Miranda [name], [place]; 12/01/2017 9:56 AM) Age at menarche  3 years. Contraceptive History  Oral contraceptives. Gravida  3 Length (months) of breastfeeding  12-24 Maternal age  39-30 Para  2 Regular periods   Other Problems (Miranda [name], [place]; 12/01/2017 9:56 AM) Migraine Headache     Review  of Systems (Miranda A. Miranda [name] RMA; 12/01/2017 9:56 AM) General Not Present- Appetite Loss, Chills, Fatigue, Fever, Night Sweats, Weight Gain and Weight Loss. Skin Not Present- Change in Wart/Mole, Dryness, Hives, Jaundice, New Lesions, Non-Healing Wounds, Rash and Ulcer. HEENT Not Present- Earache, Hearing Loss, Hoarseness, Nose Bleed, Oral Ulcers, Ringing in the Ears, Seasonal Allergies, Sinus Pain, Sore Throat, Visual Disturbances, Wears glasses/contact lenses and Yellow Eyes. Respiratory Not Present- Bloody sputum, Chronic Cough, Difficulty Breathing, Snoring and Wheezing. Breast Present- Breast Mass. Not Present- Breast Pain, Nipple Discharge and Skin Changes. Cardiovascular Not Present- Chest Pain, Difficulty Breathing Lying Down, Leg Cramps, Palpitations, Rapid Heart Rate, Shortness of Breath and Swelling of Extremities. Gastrointestinal Not Present- Abdominal Pain, Bloating, Bloody Stool, Change in Bowel Habits, Chronic diarrhea, Constipation, Difficulty Swallowing, Excessive gas, Gets full quickly at meals, Hemorrhoids, Indigestion, Nausea, Rectal Pain and Vomiting. Female Genitourinary Not Present- Frequency, Nocturia, Painful Urination, Pelvic Pain and Urgency. Musculoskeletal Not Present- Back Pain, Joint Pain, Joint Stiffness, Muscle Pain, Muscle Weakness and Swelling of Extremities. Neurological Present- Headaches. Not Present- Decreased Memory, Fainting, Numbness, Seizures, Tingling, Tremor, Trouble walking and Weakness. Psychiatric Not Present- Anxiety, Bipolar, Change in Sleep Pattern, Depression, Fearful and Frequent crying. Endocrine Not Present- Cold Intolerance, Excessive Hunger, Hair Changes, Heat Intolerance, Hot flashes and New Diabetes. Hematology Not Present- Blood Thinners, Easy Bruising, Excessive bleeding, Gland problems, HIV and Persistent Infections.  Vitals (Miranda A. Miranda [name] RMA; 12/01/2017 9:57 AM) 12/01/2017 9:56 AM Weight: 150 lb Height: 67in Body Surface Area: 1.79  m Body Mass Index: 23.49 kg/m  Temp.: 98.74F  Pulse: 69 (Regular)  BP:  118/78 (Sitting, Left Arm, Standard)       Physical Exam Rolm Bookbinder MD; 12/01/2017 1:20 PM) General Mental Status-Alert.  Head and Neck Trachea-midline. Thyroid Gland Characteristics - normal size and consistency.  Eye Sclera/Conjunctiva - Bilateral-No scleral icterus.  Chest and Lung Exam Chest and lung exam reveals -quiet, even and easy respiratory effort with no use of accessory muscles and on auscultation, normal breath sounds, no adventitious sounds and normal vocal resonance.  Breast Nipples-No Discharge. Breast Lump-No Palpable Breast Mass.  Cardiovascular Cardiovascular examination reveals -normal heart sounds, regular rate and rhythm with no murmurs.  Neurologic Neurologic evaluation reveals -alert and oriented x 3 with no impairment of recent or remote memory.  Lymphatic Head & Neck  General Head & Neck Lymphatics: Bilateral - Description - Normal. Axillary  General Axillary Region: Bilateral - Description - Normal. Note: no [place] adenopathy     Assessment & Plan Rolm Bookbinder MD; 12/01/2017 1:26 PM) ABNORMAL MAMMOGRAM OF RIGHT BREAST (R92.8) Story: I think due to fh and discordance this area should be excised. we discussed seed guided excisional biopsy of this area with risks/recovery, will proceed asap per her request AT [place] (Z91.89) Story: Using T-C her lifetime risk of breast cancer is over 30%. she has genetics pending also. I think at minimum following her with MRI and MM would be indicated at this point and we discussed a plan for that. she also qualifies for conversation on tamoxifen chemoprevention. once we have surgery results back I will send her to discuss chemoprevention with oncology as well.

## 2017-12-07 NOTE — Anesthesia Procedure Notes (Signed)
Procedure Name: LMA Insertion Date/Time: 12/07/2017 10:57 AM Performed by: Glynda Jaeger, CRNA Pre-anesthesia Checklist: Patient identified, Emergency Drugs available, Suction available, Timeout performed and Patient being monitored Patient Re-evaluated:Patient Re-evaluated prior to induction Oxygen Delivery Method: Circle system utilized Preoxygenation: Pre-oxygenation with 100% oxygen Induction Type: IV induction Ventilation: Mask ventilation without difficulty LMA: LMA inserted LMA Size: 4.0 Number of attempts: 1 Tube secured with: Tape Dental Injury: Teeth and Oropharynx as per pre-operative assessment

## 2017-12-07 NOTE — Interval H&P Note (Signed)
History and Physical Interval Note:  12/07/2017 10:18 AM  Miranda [name]  has presented today for surgery, with the diagnosis of RIGHT BREAST MASS  The various methods of treatment have been discussed with the patient and family. After consideration of risks, benefits and other options for treatment, the patient has consented to  Procedure(s): RADIOACTIVE SEED GUIDED EXCISIONAL BREAST BIOPSY (Right) as a surgical intervention .  The patient's history has been reviewed, patient examined, no change in status, stable for surgery.  I have reviewed the patient's chart and labs.  Questions were answered to the patient's satisfaction.     Rolm Bookbinder

## 2017-12-08 ENCOUNTER — Encounter (HOSPITAL_COMMUNITY): Payer: Self-pay | Admitting: General Surgery

## 2017-12-23 ENCOUNTER — Other Ambulatory Visit: Payer: Self-pay | Admitting: General Surgery

## 2017-12-23 DIAGNOSIS — Z1231 Encounter for screening mammogram for malignant neoplasm of breast: Secondary | ICD-10-CM

## 2018-01-06 ENCOUNTER — Encounter: Payer: Self-pay | Admitting: Oncology

## 2018-01-06 ENCOUNTER — Telehealth: Payer: Self-pay | Admitting: Oncology

## 2018-01-06 NOTE — Telephone Encounter (Signed)
New high risk clinic referral received from Dr. Donne Hazel at [facility]. Pt has been scheduled to see Dr. Jana Hakim on 7/2 at 4pm. Pt aware to arrive 30 minutes early. Letter mailed.

## 2018-01-24 NOTE — Progress Notes (Signed)
[facility]  [phone] Fax:(336) [phone]   PATIENT FAILED TO SHOW FOR APPOINTMENT 01/25/2018  ID: Elio Forget DOB: [birth year]  MR#: [id number]  GYI#:[id number]  Patient Care Team: Molli Posey, MD as PCP - General (Obstetrics and Gynecology) Shawnie Dapper OTHER MD:  CHIEF COMPLAINT:   CURRENT TREATMENT:    HISTORY OF CURRENT ILLNESS: ALMIRA PHETTEPLACE had routine screening mammography showing a possible distortion in the right breast. She underwent bilateral diagnostic mammography with tomography and right breast ultrasonography at [facility] on 11/10/2017 showing: Breast density category D.  Persistent architectural distortion involving the upper inner quadrant of the right breast at posterior depth, without associated mass and without sonographic correlate. No pathologic RIGHT axillary lymphadenopathy  Accordingly on 11/15/2017 she proceeded to biopsy of the right breast area in question. The pathology from this procedure showed ([id number]): There was no evidence of malignancy.   She underwent a right lumpectomy of this area ([id number]) on 12/07/2017 with pathology showing: dilated ducts. Fibrocystic changes with adenosis and calcifications. . In the inferior margin, there was focal intraductal papilloma, dilated ducts. There was no evidence of malignancy.  Genetics testing through the myriad risk profile obtained through Dr. Glyn Ade showed no deleterious mutations.  Her lifetime calculated risk of breast cancer was 23.7% in his study.  The patient's subsequent history is as detailed below.  INTERVAL HISTORY:    REVIEW OF SYSTEMS:    PAST MEDICAL HISTORY: Past Medical History:  Diagnosis Date  . Anxiety   . Depression   . Migraines   . PONV (postoperative nausea and vomiting)     PAST SURGICAL HISTORY: Past Surgical History:  Procedure Laterality Date  . BREAST BIOPSY    . BREAST LUMPECTOMY WITH NEEDLE  LOCALIZATION Right 12/07/2017   Procedure: BREAST LUMPECTOMY WITH NEEDLE LOCALIZATION AND RADIOACTIVE SEED;  Surgeon: Rolm Bookbinder, MD;  Location: [facility];  Service: General;  Laterality: Right;  . DILATION AND EVACUATION N/A 09/11/2016   Procedure: DILATATION AND EVACUATION;  Surgeon: Molli Posey, MD;  Location: [facility];  Service: Gynecology;  Laterality: N/A;  . SHOULDER ARTHROSCOPY Right   . SINUS SURGERY WITH INSTATRAK      FAMILY HISTORY Family History  Problem Relation Age of Onset  . BRCA 1/2 Mother   . Parkinson's disease Father     GYNECOLOGIC HISTORY:  No LMP recorded. Menarche:  years old Age at first live birth:  years old [place]  LMP  Contraceptive: The patient is interested in using oral contraceptives, specifically Lo Loestrin HRT   Hysterectomy?  SO?    SOCIAL HISTORY:      ADVANCED DIRECTIVES:    HEALTH MAINTENANCE: Social History   Tobacco Use  . Smoking status: Never Smoker  . Smokeless tobacco: Never Used  Substance Use Topics  . Alcohol use: Yes    Comment: none since pregnancy  . Drug use: No     Colonoscopy:  PAP:  Bone density:   No Known Allergies  Current Outpatient Medications  Medication Sig Dispense Refill  . aspirin-acetaminophen-caffeine (EXCEDRIN MIGRAINE) 250-250-65 MG tablet Take 2 tablets by mouth daily as needed for headache.    . escitalopram (LEXAPRO) 5 MG tablet Take 5 mg by mouth daily.    Marland Kitchen MELATONIN PO Take 1 tablet by mouth at bedtime as needed (sleep).    . Multiple Vitamin (MULTIVITAMIN WITH MINERALS) TABS tablet Take 1 tablet by mouth daily.    Marland Kitchen topiramate (TOPAMAX) 25 MG tablet Take 1  tablet (25 mg total) by mouth 2 (two) times daily. 60 tablet 6  . traMADol (ULTRAM) 50 MG tablet Take 2 tablets (100 mg total) by mouth every 6 (six) hours as needed. 10 tablet 0   No current facility-administered medications for this visit.     OBJECTIVE:  There were no vitals filed for this visit.   There is no height  or weight on file to calculate BMI.   Wt Readings from Last 3 Encounters:  12/07/17 145 lb (65.8 kg)  10/11/17 159 lb 12.8 oz (72.5 kg)  10/06/17 150 lb (68 kg)      ECOG FS:    LAB RESULTS:  CMP     Component Value Date/Time   NA 139 10/06/2017 1732   K 3.9 10/06/2017 1732   CL 104 10/06/2017 1732   CO2 24 10/06/2017 1732   GLUCOSE 86 10/06/2017 1732   BUN 12 10/06/2017 1732   CREATININE 0.69 10/06/2017 1732   CALCIUM 9.3 10/06/2017 1732   PROT 7.0 10/06/2017 1732   ALBUMIN 4.8 10/06/2017 1732   AST 41 10/06/2017 1732   ALT 48 10/06/2017 1732   ALKPHOS 31 (L) 10/06/2017 1732   BILITOT 0.9 10/06/2017 1732   GFRNONAA >60 10/06/2017 1732   GFRAA >60 10/06/2017 1732    No results found for: TOTALPROTELP, ALBUMINELP, A1GS, A2GS, BETS, BETA2SER, GAMS, MSPIKE, SPEI  No results found for: KPAFRELGTCHN, LAMBDASER, KAPLAMBRATIO  Lab Results  Component Value Date   WBC 10.9 (H) 12/07/2017   NEUTROABS 4.9 10/06/2017   HGB 14.3 12/07/2017   HCT 42.5 12/07/2017   MCV 95.9 12/07/2017   PLT 306 12/07/2017    _0 @  No results found for: LABCA2  No components found for: [id number]  No results for input(s): INR in the last 168 hours.  No results found for: LABCA2  No results found for: XNT700  No results found for: FVC944  No results found for: HQP591  No results found for: CA2729  No components found for: HGQUANT  No results found for: CEA1 / No results found for: CEA1   No results found for: AFPTUMOR  No results found for: CHROMOGRNA  No results found for: PSA1  No visits with results within 3 Day(s) from this visit.  Latest known visit with results is:  Admission on 12/07/2017, Discharged on 12/07/2017  Component Date Value Ref Range Status  . WBC 12/07/2017 10.9* 4.0 - 10.5 K/uL Final  . RBC 12/07/2017 4.43  3.87 - 5.11 MIL/uL Final  . Hemoglobin 12/07/2017 14.3  12.0 - 15.0 g/dL Final  . HCT 12/07/2017 42.5  36.0 - 46.0 % Final  . MCV  12/07/2017 95.9  78.0 - 100.0 fL Final  . MCH 12/07/2017 32.3  26.0 - 34.0 pg Final  . MCHC 12/07/2017 33.6  30.0 - 36.0 g/dL Final  . RDW 12/07/2017 12.1  11.5 - 15.5 % Final  . Platelets 12/07/2017 306  150 - 400 K/uL Final   Performed at [facility], [facility] [address] [place], [place] [address]  . Preg Test, Ur 12/07/2017 NEGATIVE  NEGATIVE Final   Comment:        THE SENSITIVITY OF THIS METHODOLOGY IS >24 mIU/mL     (this displays the last labs from the last 3 days)  No results found for: TOTALPROTELP, ALBUMINELP, A1GS, A2GS, BETS, BETA2SER, GAMS, MSPIKE, SPEI (this displays SPEP labs)  No results found for: KPAFRELGTCHN, LAMBDASER, KAPLAMBRATIO (kappa/lambda light chains)  No results found for: HGBA, HGBA2QUANT,  HGBFQUANT, HGBSQUAN (Hemoglobinopathy evaluation)   Lab Results  Component Value Date   LDH 193 07/08/2007    No results found for: IRON, TIBC, IRONPCTSAT (Iron and TIBC)  No results found for: FERRITIN  Urinalysis    Component Value Date/Time   COLORURINE YELLOW 10/06/2017 1815   APPEARANCEUR CLEAR 10/06/2017 1815   LABSPEC 1.010 10/06/2017 1815   PHURINE 8.0 10/06/2017 1815   GLUCOSEU NEGATIVE 10/06/2017 1815   HGBUR NEGATIVE 10/06/2017 1815   BILIRUBINUR NEGATIVE 10/06/2017 1815   KETONESUR 15 (A) 10/06/2017 1815   PROTEINUR NEGATIVE 10/06/2017 1815   UROBILINOGEN 0.2 07/03/2007 1219   NITRITE NEGATIVE 10/06/2017 1815   LEUKOCYTESUR NEGATIVE 10/06/2017 1815     STUDIES: No results found.  ELIGIBLE FOR AVAILABLE RESEARCH PROTOCOL:   ASSESSMENT: 42 y.o. [place], [place] woman status post right breast upper outer quadrant lumpectomy 12/07/2017 showing an intraductal papilloma.  (1) breast cancer high risk:  (a) family history of breast and ovarian cancer  (b) breast density D   (2) genetics testing  PLAN: We spent the better part of today's hour-long appointment discussing the biology of her diagnosis and the specifics of her  situation  Janicia is interested in using Lo Loestrin (ethinyl estradiol and norethindrone) for birth control.  We discussed the fact that a copper IUD is an effective contraceptive that has absolutely no hormonal contraindications.  In patients taking tamoxifen I frequently recommend a Mirena IUD which tends to decrease the concern regarding endometrial hyperplasia.  This of course is less of an issue if the patient continues to menstruate regularly.   Catera has a good understanding of the overall plan. She agrees with it. She knows the goal of treatment in her case is cure. She will call with any problems that may develop before her next visit here.   Kalie Cabral, Virgie Dad, MD  01/24/18 3:50 PM Medical Oncology and Hematology [facility] [address] [place], [place] [address] Tel. [phone]    Fax. [phone]

## 2018-01-25 ENCOUNTER — Encounter: Payer: Self-pay | Admitting: Oncology

## 2018-01-25 ENCOUNTER — Inpatient Hospital Stay: Payer: BLUE CROSS/BLUE SHIELD | Attending: Oncology | Admitting: Oncology

## 2018-01-25 DIAGNOSIS — D241 Benign neoplasm of right breast: Secondary | ICD-10-CM

## 2018-01-25 DIAGNOSIS — Z1371 Encounter for nonprocreative screening for genetic disease carrier status: Secondary | ICD-10-CM

## 2018-01-25 DIAGNOSIS — Z1231 Encounter for screening mammogram for malignant neoplasm of breast: Secondary | ICD-10-CM

## 2018-01-25 DIAGNOSIS — Z1239 Encounter for other screening for malignant neoplasm of breast: Secondary | ICD-10-CM | POA: Insufficient documentation

## 2018-01-25 DIAGNOSIS — R922 Inconclusive mammogram: Secondary | ICD-10-CM

## 2018-01-25 DIAGNOSIS — Z803 Family history of malignant neoplasm of breast: Secondary | ICD-10-CM

## 2018-01-25 DIAGNOSIS — Z8041 Family history of malignant neoplasm of ovary: Secondary | ICD-10-CM

## 2018-01-31 ENCOUNTER — Other Ambulatory Visit: Payer: Self-pay | Admitting: Oncology

## 2018-01-31 ENCOUNTER — Encounter: Payer: Self-pay | Admitting: Oncology

## 2018-01-31 NOTE — Progress Notes (Signed)
ADDENDUM:  The patient was evaluated through the myriad my risk panel, which showed no clinically significant mutations.  The date of the test is November 08, 2017.  The panel included the APC, ATM, AX IN 2, PAR D1, BMP are 1A, BRCA1, BRCA2, BRI P1, C BH 1, CDK 4, CDK and 2 8, CH EK 2, EP CAM, H0 XB 1 3, GAL and T12, mL H1, MSH 2, MSH 3, MSH 6, MUTYH, NBN, NT HL, PALB 2, PMS 2, PT EN, RAD 51C, R 80 51D, RNFA 4 3, R PS 2 0, SMA D4, STK 11, TP 6 3.  Tyer Cusick score of 23.7% was calculated as the patient's lifetime risk of developing breast cancer.

## 2018-02-09 ENCOUNTER — Telehealth: Payer: Self-pay | Admitting: *Deleted

## 2018-02-09 ENCOUNTER — Ambulatory Visit: Payer: Managed Care, Other (non HMO) | Admitting: Diagnostic Neuroimaging

## 2018-02-09 NOTE — Telephone Encounter (Signed)
Pt no showed for appointment today.

## 2018-02-10 ENCOUNTER — Encounter: Payer: Self-pay | Admitting: Diagnostic Neuroimaging

## 2018-07-21 ENCOUNTER — Ambulatory Visit: Payer: Self-pay | Admitting: Family Medicine

## 2018-07-21 VITALS — BP 115/70 | HR 88 | Temp 101.8°F | Resp 18

## 2018-07-21 DIAGNOSIS — J101 Influenza due to other identified influenza virus with other respiratory manifestations: Secondary | ICD-10-CM

## 2018-07-21 DIAGNOSIS — R0981 Nasal congestion: Secondary | ICD-10-CM

## 2018-07-21 DIAGNOSIS — R6889 Other general symptoms and signs: Secondary | ICD-10-CM

## 2018-07-21 LAB — POCT INFLUENZA A/B
INFLUENZA B, POC: POSITIVE — AB
Influenza A, POC: NEGATIVE

## 2018-07-21 MED ORDER — AZELASTINE HCL 0.1 % NA SOLN: 1.0000 | Freq: Two times a day (BID) | NASAL | 0 refills | Status: DC

## 2018-07-21 MED ORDER — PSEUDOEPH-BROMPHEN-DM 30-2-10 MG/5ML PO SYRP: 10.0000 mL | ORAL_SOLUTION | Freq: Three times a day (TID) | ORAL | 0 refills | Status: DC | PRN

## 2018-07-21 MED ORDER — OSELTAMIVIR PHOSPHATE 75 MG PO CAPS: 75.0000 mg | ORAL_CAPSULE | Freq: Two times a day (BID) | ORAL | 0 refills | Status: AC

## 2018-07-21 NOTE — Progress Notes (Signed)
MRN: [id number] DOB: 1976-05-25  Subjective:   Miranda [name] is a 42 y.o. female presenting for chief complaint of cough and sore throat for the last 5 days and new onset fever and chills since morning.  Reports 5 history of rhinorrhea and dry cough (no reported hemoptyosis), chills, fatigue and malaise. Has tried Dayquil OTC for relief. Denies sinus headache, itchy watery eyes, ear pain, wheezing, shortness of breath and productive cough, nausea, vomiting, abdominal pain and diarrhea. Has had sick contact with son. Denies any other aggravating or relieving factors, no other questions or concerns.  Review of Systems  HENT: Positive for sore throat. Negative for congestion, ear discharge, ear pain and sinus pain.   Eyes: Negative.   Respiratory: Positive for cough. Negative for sputum production and shortness of breath.   Cardiovascular: Negative.   Gastrointestinal: Negative for abdominal pain, diarrhea, nausea and vomiting.  Skin: Negative.   Neurological: Negative for headaches.  Endo/Heme/Allergies: Negative.   Psychiatric/Behavioral: Negative.      Miranda [name] has a current medication list which includes the following prescription(s): escitalopram, multivitamin with minerals, aspirin-acetaminophen-caffeine, melatonin, topiramate, and tramadol. Also has No Known Allergies.  Miranda [name]  has a past medical history of Anxiety, Depression, Migraines, and PONV (postoperative nausea and vomiting). Also  has a past surgical history that includes Shoulder arthroscopy (Right); Sinus surgery with Instatrak; Dilation and evacuation (N/A, 09/11/2016); Breast biopsy; and Breast lumpectomy with needle localization (Right, 12/07/2017).   Objective:   Vitals: BP 115/70 (BP Location: Right Arm, Patient Position: Sitting, Cuff Size: Normal)   Pulse 88   Temp (!) 101.8 F (38.8 C) (Oral)   Resp 18   SpO2 98%   Physical Exam Vitals signs reviewed.  Constitutional:      Appearance: She is well-developed.  She is not toxic-appearing.  HENT:     Head: Normocephalic.     Right Ear: Hearing, tympanic membrane, ear canal and external ear normal.     Left Ear: Hearing, tympanic membrane, ear canal and external ear normal.     Nose: Mucosal edema, congestion and rhinorrhea present.     Right Sinus: Frontal sinus tenderness present. No maxillary sinus tenderness.     Left Sinus: Frontal sinus tenderness present. No maxillary sinus tenderness.     Mouth/Throat:     Mouth: Mucous membranes are moist.     Pharynx: Uvula midline.     Tonsils: Swelling: 1+ on the right. 1+ on the left.  Neck:     Musculoskeletal: Normal range of motion and neck supple.  Cardiovascular:     Rate and Rhythm: Normal rate and regular rhythm.     Pulses: Normal pulses.     Heart sounds: Normal heart sounds.  Pulmonary:     Effort: Pulmonary effort is normal. No tachypnea or respiratory distress.     Breath sounds: Normal breath sounds. No decreased breath sounds, wheezing, rhonchi or rales.  Chest:     Chest wall: No tenderness.  Abdominal:     General: Bowel sounds are normal.     Palpations: Abdomen is soft.  Musculoskeletal: Normal range of motion.  Lymphadenopathy:     Head:     Right side of head: Tonsillar adenopathy present. No submental or submandibular adenopathy.     Left side of head: Tonsillar adenopathy present. No submental or submandibular adenopathy.     Cervical: No cervical adenopathy.  Skin:    General: Skin is warm.  Neurological:     Mental Status: She is alert  and oriented to person, place, and time.    Results for orders placed or performed in visit on 07/21/18 (from the past 24 hour(s))  POCT Influenza A/B     Status: Abnormal   Collection Time: 07/21/18  4:34 PM  Result Value Ref Range   Influenza A, POC Negative Negative   Influenza B, POC Positive (A) Negative     Assessment and Plan :  1. Influenza B Positive flu test- discussed exam findings, diagnosis etiology and  medication use and indications reviewed with patient. Follow- Up and discharge instructions provided. No emergent/urgent issues found on exam. Patient was concerned about sinus infection discussed the potential sequela of flu concerning secondary infections of sinus, pneumonia and otitis. Discussed RTC and ED precautions. Educational handout on Influenza. Tylenol and Motrin use as needed for pain, malaise and fever. Advised to follow up in clinic if symptoms not improved in 48-72 hours with treatment prescribed today. Patient verbalized understanding of information provided and agrees with plan of care (POC), all questions answered.  1. Influenza B - oseltamivir (TAMIFLU) 75 MG capsule; Take 1 capsule (75 mg total) by mouth 2 (two) times daily for 5 days. - brompheniramine-pseudoephedrine-DM 30-2-10 MG/5ML syrup; Take 10 mLs by mouth 3 (three) times daily as needed.  2. Flu-like symptoms - POCT Influenza A/B - oseltamivir (TAMIFLU) 75 MG capsule; Take 1 capsule (75 mg total) by mouth 2 (two) times daily for 5 days.  3. Nasal congestion - brompheniramine-pseudoephedrine-DM 30-2-10 MG/5ML syrup; Take 10 mLs by mouth 3 (three) times daily as needed. - azelastine (ASTELIN) 0.1 % nasal spray; Place 1 spray into both nostrils 2 (two) times daily. Use in each nostril as directed    07/21/2018 4:33 PM

## 2018-07-21 NOTE — Patient Instructions (Signed)

## 2018-11-23 ENCOUNTER — Other Ambulatory Visit: Payer: Self-pay | Admitting: Obstetrics and Gynecology

## 2018-11-23 DIAGNOSIS — Z803 Family history of malignant neoplasm of breast: Secondary | ICD-10-CM

## 2019-10-12 ENCOUNTER — Ambulatory Visit: Payer: Self-pay | Attending: Internal Medicine

## 2019-10-12 DIAGNOSIS — Z23 Encounter for immunization: Secondary | ICD-10-CM

## 2019-10-12 NOTE — Progress Notes (Signed)
   Covid-19 Vaccination Clinic  Name:  Miranda [name]    MRN: [id number] DOB: 1975-12-06  10/12/2019  Miranda [name] was observed post Covid-19 immunization for 15 minutes without incident. She was provided with Vaccine Information Sheet and instruction to access the V-Safe system.   Miranda [name] was instructed to call 911 with any severe reactions post vaccine: Marland Kitchen Difficulty breathing  . Swelling of face and throat  . A fast heartbeat  . A bad rash all over body  . Dizziness and weakness   Immunizations Administered    Name Date Dose VIS Date Route   Pfizer COVID-19 Vaccine 10/12/2019 10:07 AM 0.3 mL 07/07/2019 Intramuscular   Manufacturer: Love   Lot: [id number]   [place]: [id number]

## 2019-11-02 IMAGING — CT CT HEAD W/O CM
3 series · 16 of 47 positions shown, 19 images · non-contrast
Comparison: None.

CLINICAL DATA: Right-sided weakness and numbness

EXAM:
CT HEAD WITHOUT CONTRAST
TECHNIQUE: Contiguous axial images were obtained from the base of the skull
through the vertex without intravenous contrast.

[Series 2: head wo · axial · 0.43mm/px · z∈[-144,-4]mm · 10 of 34 slices shown, 13 images]
[im 3/34  brain]
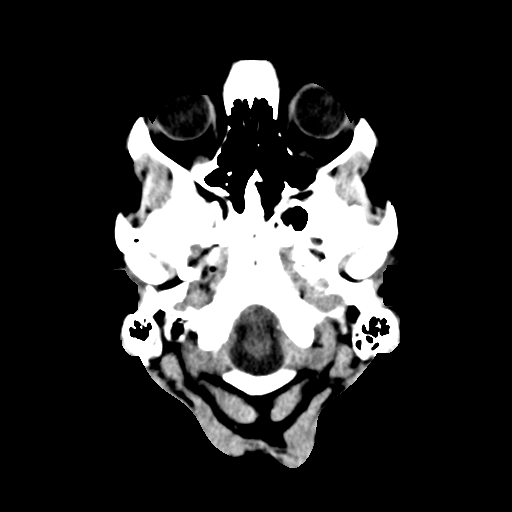
[im 3/34  bone]
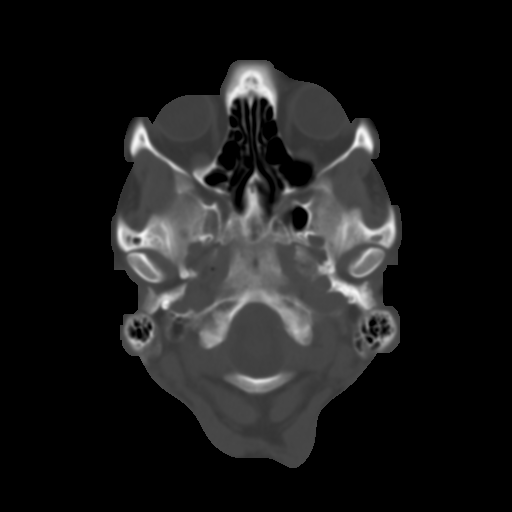
[im 6/34  brain]
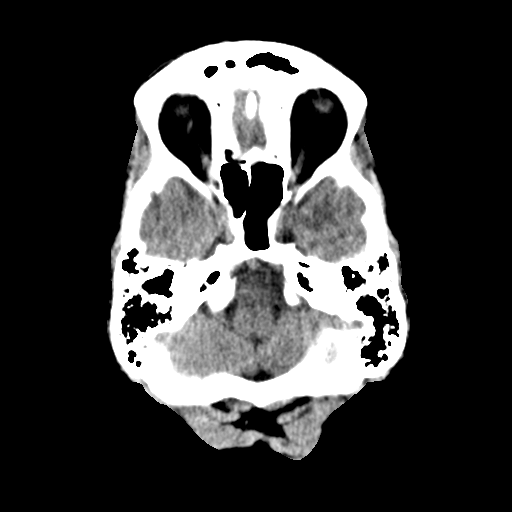
[im 10/34  brain]
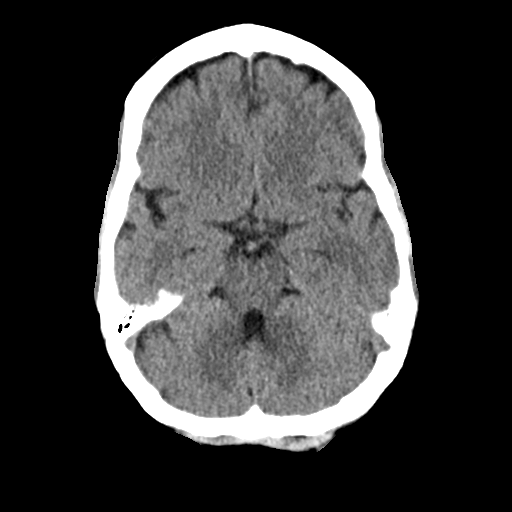
[im 12/34  brain]
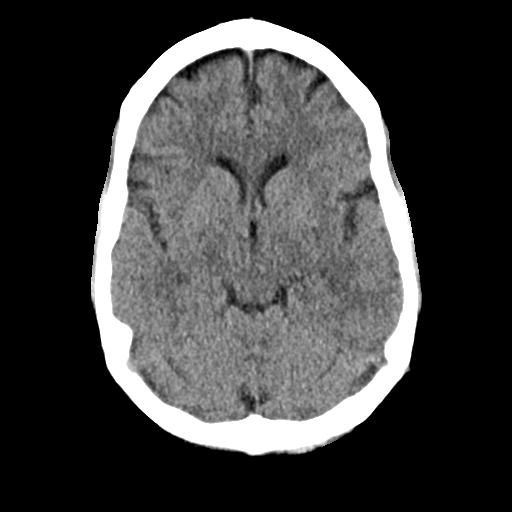
[im 15/34  brain]
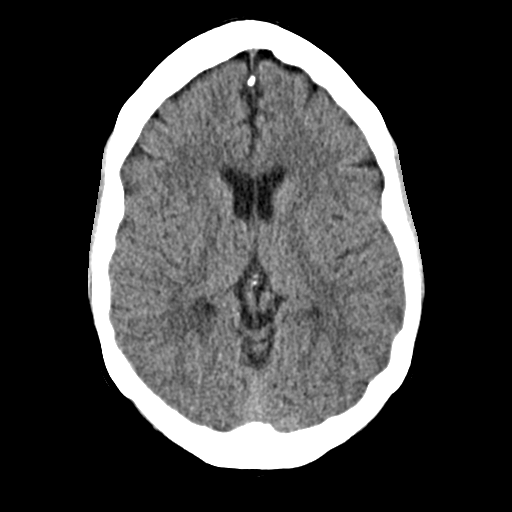
[im 15/34  bone]
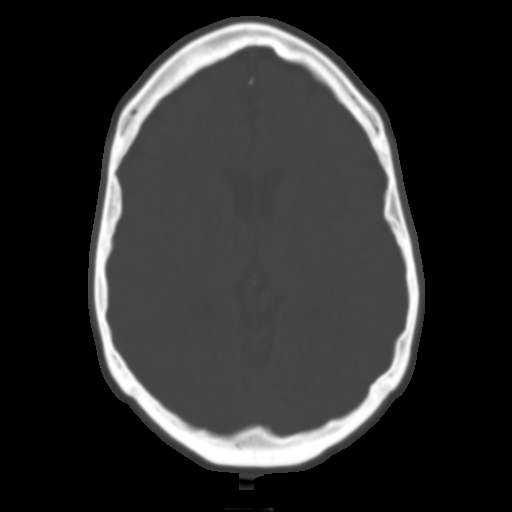
[im 19/34  brain]
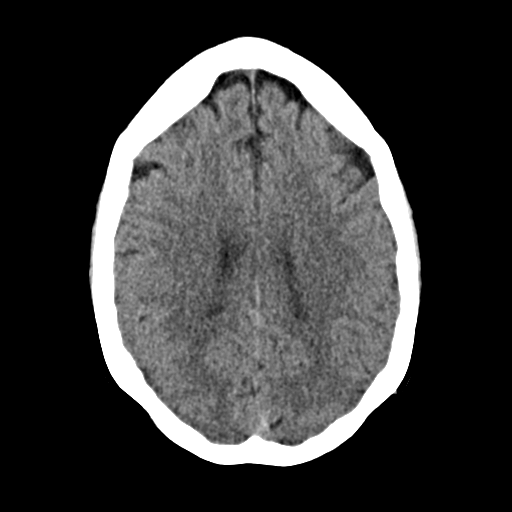
[im 22/34  brain]
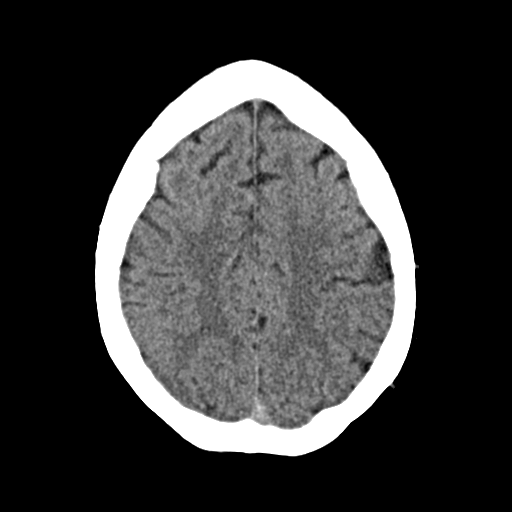
[im 26/34  brain]
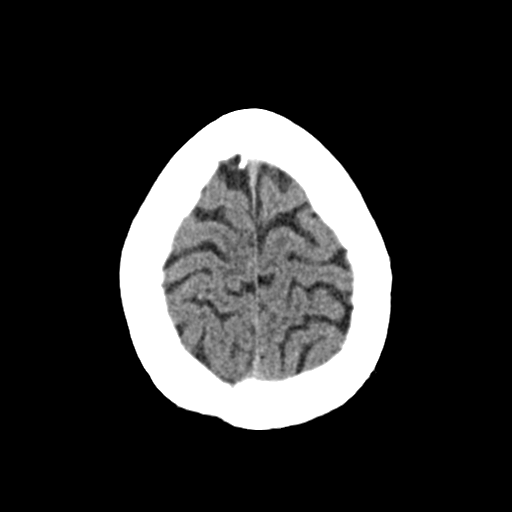
[im 28/34  brain]
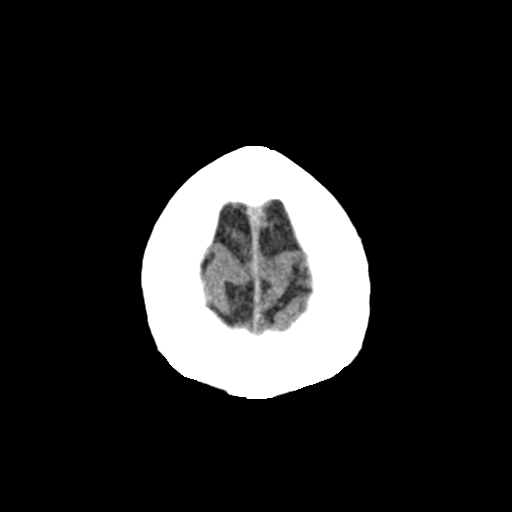
[im 28/34  bone]
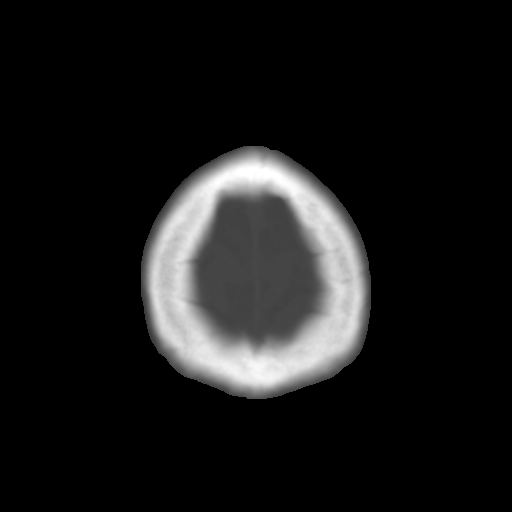
[im 31/34  brain]
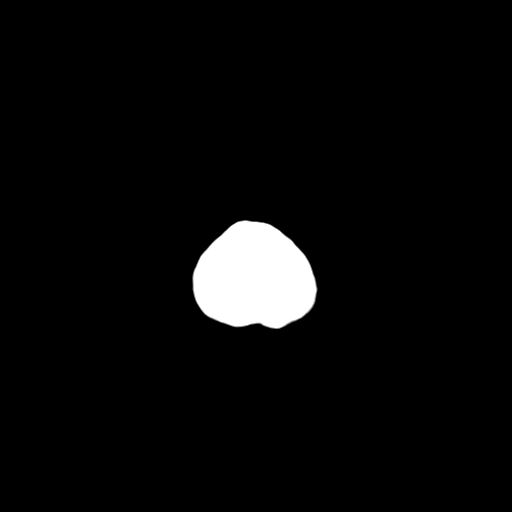

[Series 4: coronal soft · coronal · 0.33mm/px · 3 of 72 slices shown]
[im 24/72  brain]
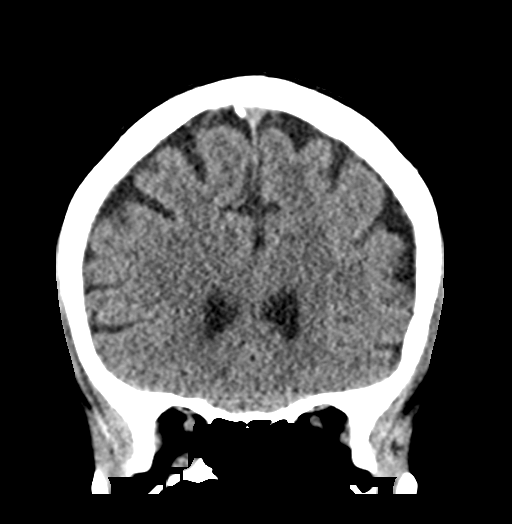
[im 32/72  brain]
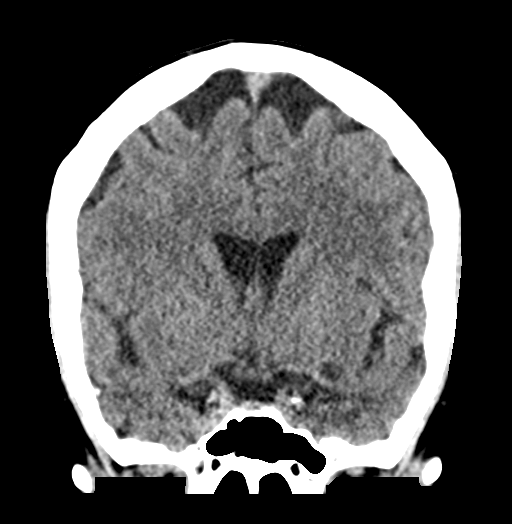
[im 40/72  brain]
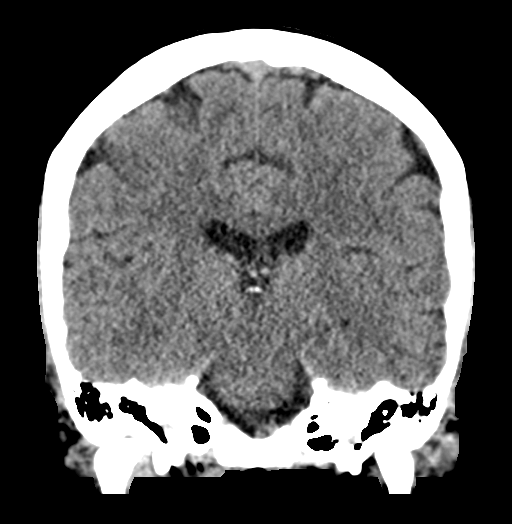

[Series 5: sag soft · sagittal · 0.34mm/px · 3 of 54 slices shown]
[im 18/54  brain]
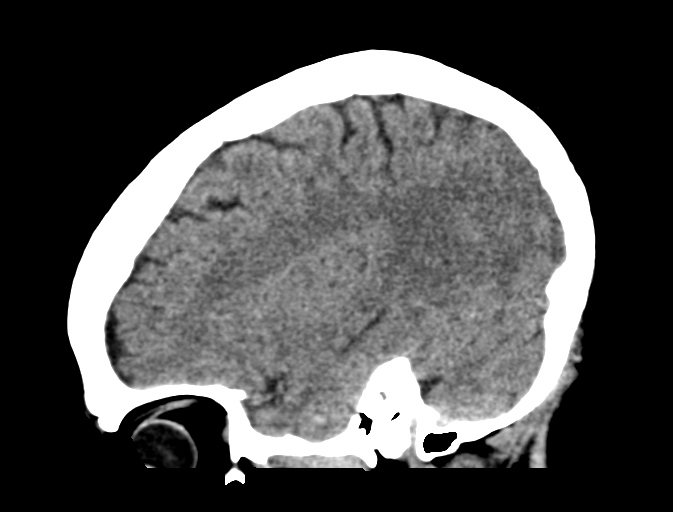
[im 27/54  brain]
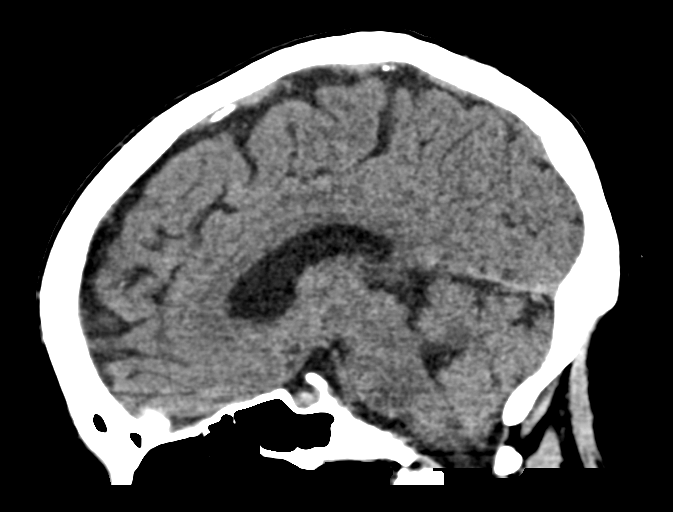
[im 36/54  brain]
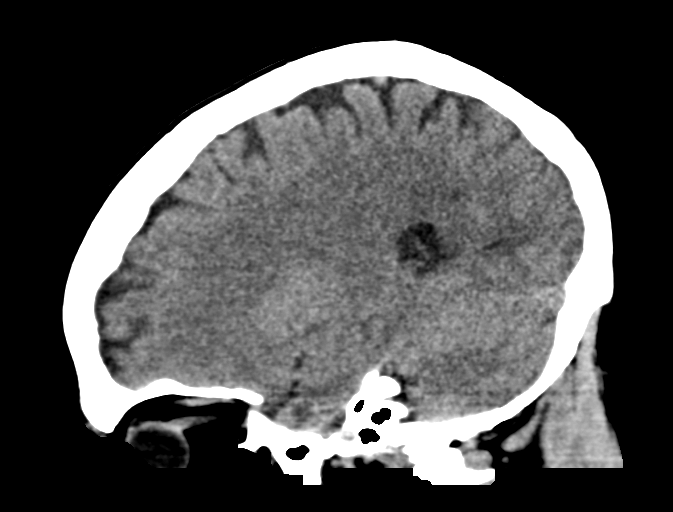

[16 of 47 positions shown; findings below may reference images not displayed]

FINDINGS: Brain: No evidence of acute infarction, hemorrhage, hydrocephalus,
extra-axial collection or mass lesion/mass effect.

Vascular: No hyperdense vessel or unexpected calcification.

Skull: Negative

Sinuses/Orbits: Negative

Other: None
IMPRESSION: Negative CT head

## 2019-11-06 ENCOUNTER — Ambulatory Visit: Payer: Self-pay | Attending: Internal Medicine

## 2019-11-06 DIAGNOSIS — Z23 Encounter for immunization: Secondary | ICD-10-CM

## 2019-11-06 NOTE — Progress Notes (Signed)
   Covid-19 Vaccination Clinic  Name:  Miranda [name]    MRN: [id number] DOB: August 07, 1975  11/06/2019  [name] was observed post Covid-19 immunization for 15 minutes without incident. She was provided with Vaccine Information Sheet and instruction to access the V-Safe system.   [name] was instructed to call 911 with any severe reactions post vaccine: Marland Kitchen Difficulty breathing  . Swelling of face and throat  . A fast heartbeat  . A bad rash all over body  . Dizziness and weakness   Immunizations Administered    Name Date Dose VIS Date Route   Pfizer COVID-19 Vaccine 11/06/2019 10:35 AM 0.3 mL 07/07/2019 Intramuscular   Manufacturer: Box   Lot: [id number]   Peggs: [id number]

## 2019-12-07 IMAGING — US ULTRASOUND RIGHT BREAST LIMITED
1 series · 4 of 4 positions shown · non-contrast
Comparison: Mammography 11/08/2017, 01/02/2016 and earlier.

ADDENDUM:
Please note in the Clinical Data section, I have been informed that
the patient's mother does not carry the BRCA gene.
CLINICAL DATA: Recall from screening mammography with
tomosynthesis, possible architectural distortion involving the
retroareolar RIGHT breast at POSTERIOR depth, visualized only on the
MLO images.

Family history of breast cancer in her mother who carries the BRCA
gene mutation.
EXAM:
DIGITAL DIAGNOSTIC RIGHT MAMMOGRAM WITH CAD AND TOMO
ULTRASOUND RIGHT BREAST

[Series 1: ultrasound right breast limited · 0.06mm/px · 4 of 4 slices shown]
[im 1/4]
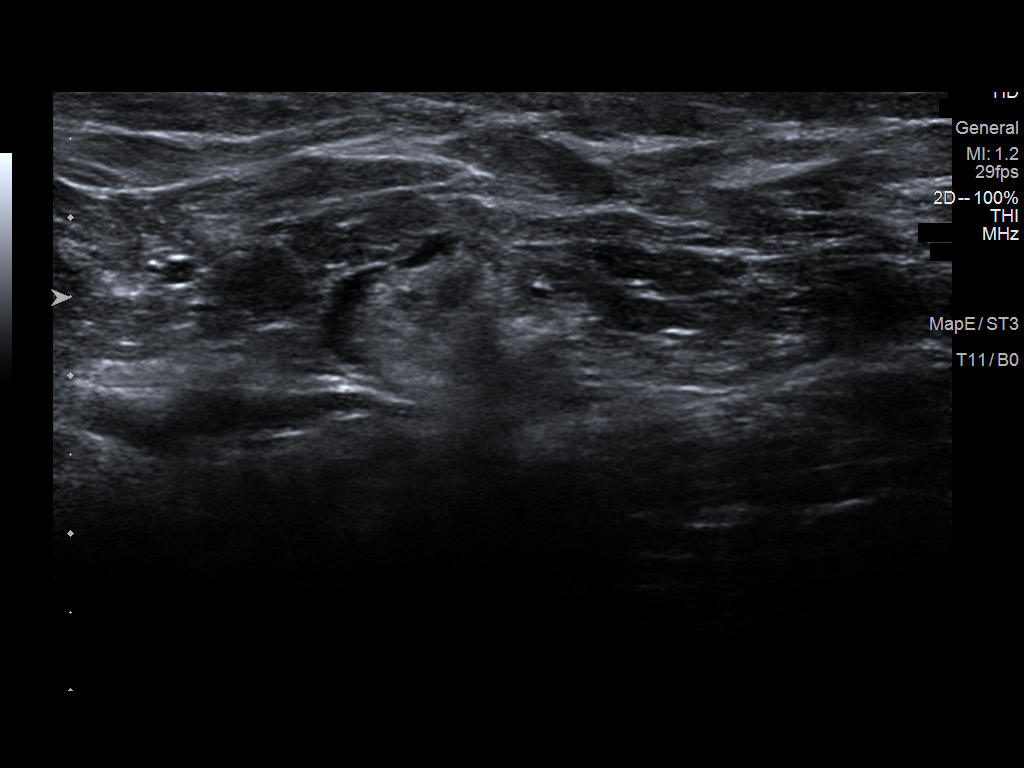
[im 2/4]
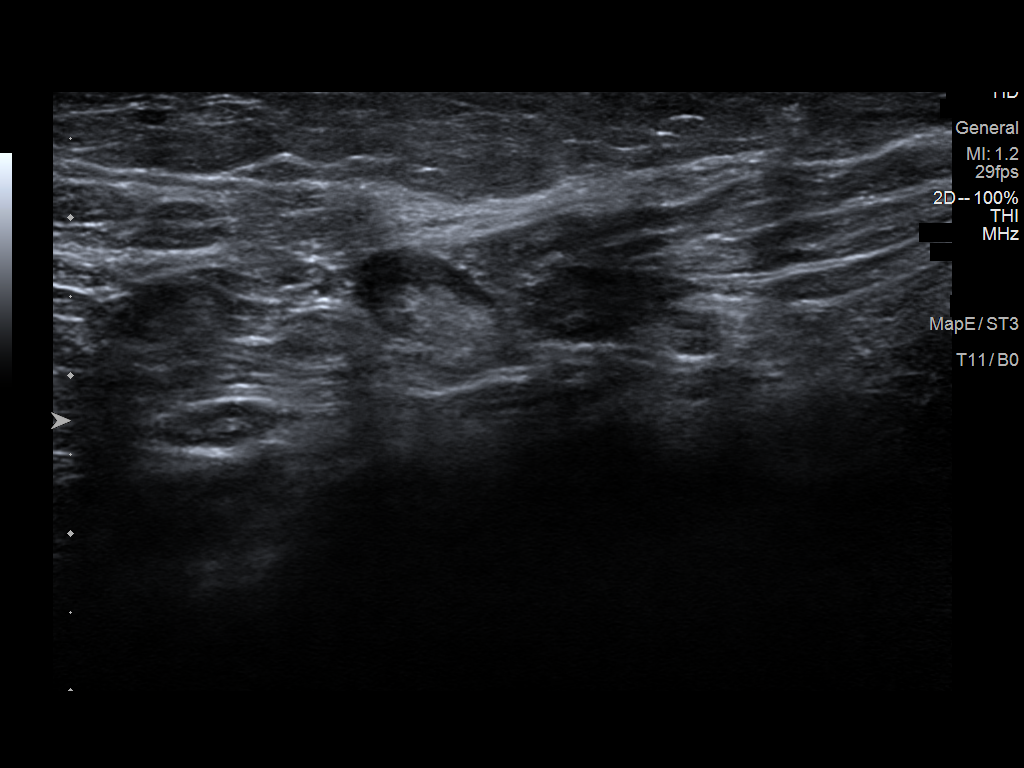
[im 3/4]
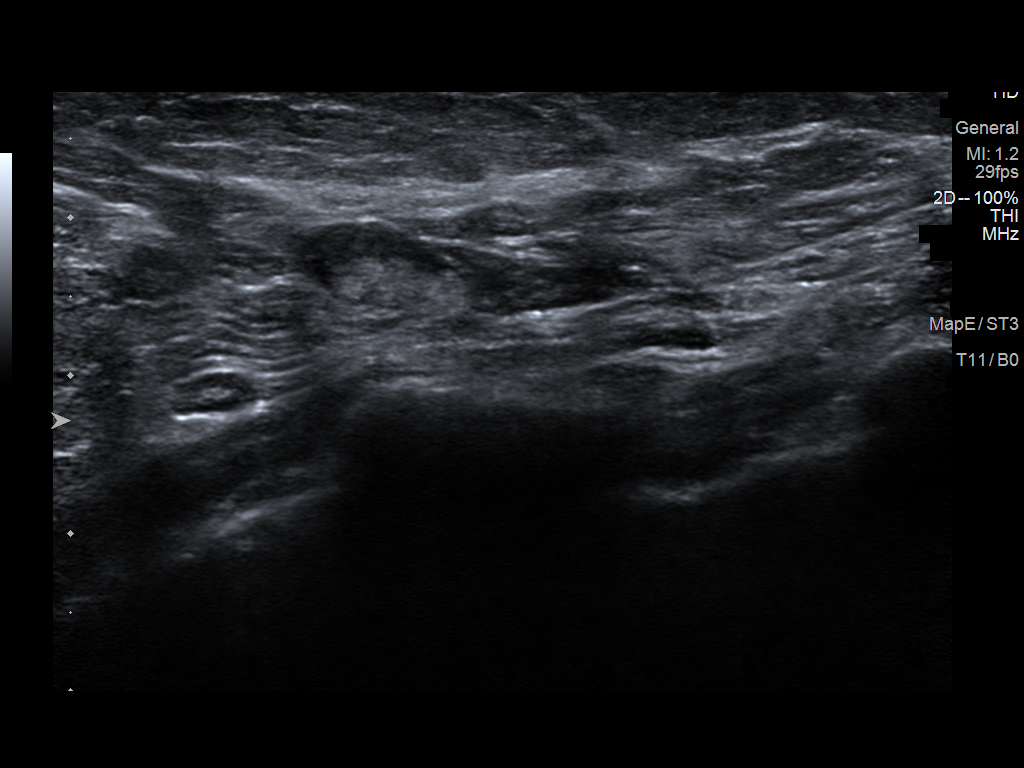
[im 4/4]
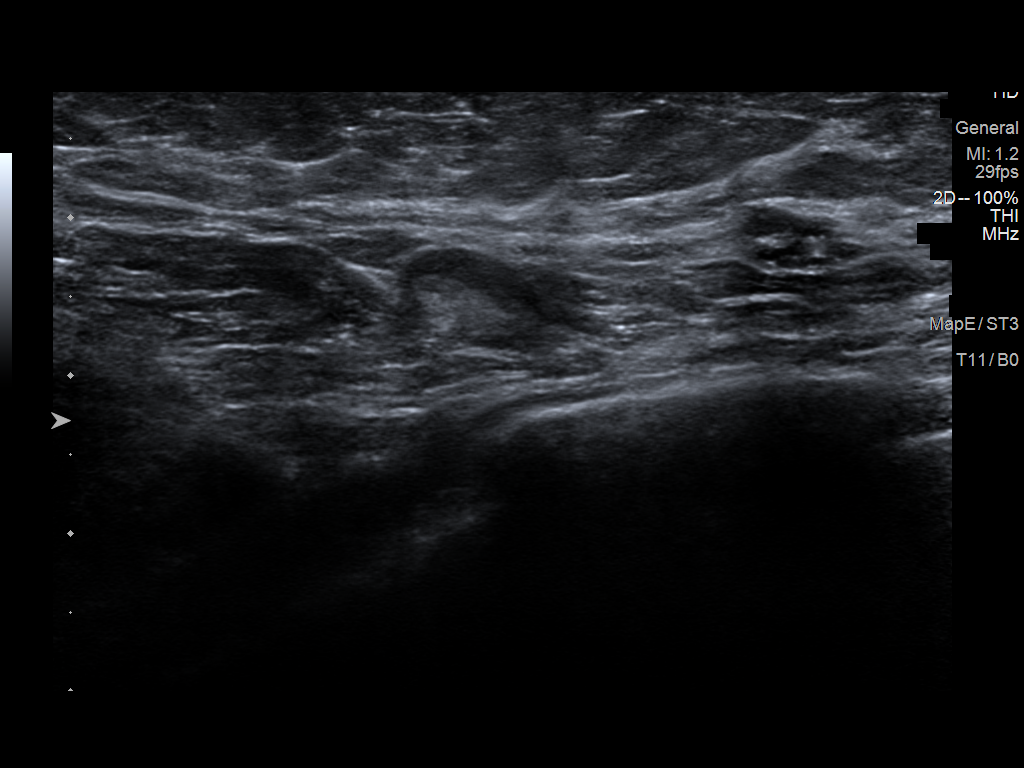

[4 of 4 positions shown; findings below may reference images not displayed]

No
prior RIGHT breast ultrasound.

ACR Breast Density Category d: The breast tissue is extremely dense,
which lowers the sensitivity of mammography.
FINDINGS: Tomosynthesis and synthesized spot compression MLO view of the area
of concern in the RIGHT breast and a tomosynthesis and synthesized
full field mediolateral view of the RIGHT breast were obtained.

The architectural distortion in the retroareolar RIGHT breast at
POSTERIOR depth persists on the spot compression tomosynthesis
images. The architectural distortion localizes to the slight UPPER
breast on the full field mediolateral images, indicating it is
likely in the slight INNER portion of the breast, therefore the
UPPER INNER QUADRANT.

The full field mediolateral image was processed with CAD.

On physical exam, there is no palpable abnormality in the UPPER
INNER RIGHT breast at POSTERIOR depth.

Targeted RIGHT breast ultrasound is performed, showing dense
fibroglandular tissue throughout the UPPER INNER QUADRANT. There is
no sonographic correlate for the mammographic distortion.

Sonographic evaluation of the RIGHT axilla demonstrates no
pathologic lymphadenopathy.
IMPRESSION: 1. Persistent architectural distortion involving the UPPER INNER
quadrant of the RIGHT breast at POSTERIOR depth, without associated
mass and without sonographic correlate.
2. No pathologic RIGHT axillary lymphadenopathy.

RECOMMENDATION:
Stereotactic tomosynthesis core needle biopsy of the RIGHT breast
distortion which is best seen in the LATERAL projection.

The stereotactic tomosynthesis core needle biopsy procedure was
discussed with the patient and her questions were answered. She has
agreed to proceed and the biopsy has been scheduled for [REDACTED],
[DATE] at [DATE] p.m..

I have discussed the findings and recommendations with the patient.
Results were also provided in writing at the conclusion of the
visit.

BI-RADS CATEGORY  4: Suspicious.

## 2021-03-04 ENCOUNTER — Other Ambulatory Visit: Payer: Self-pay | Admitting: Obstetrics and Gynecology

## 2021-03-04 DIAGNOSIS — R928 Other abnormal and inconclusive findings on diagnostic imaging of breast: Secondary | ICD-10-CM

## 2021-03-06 ENCOUNTER — Other Ambulatory Visit: Payer: Self-pay

## 2021-03-06 ENCOUNTER — Ambulatory Visit
Admission: RE | Admit: 2021-03-06 | Discharge: 2021-03-06 | Disposition: A | Discharge status: Home / Self Care | Payer: BC Managed Care – PPO | Source: Ambulatory Visit | Attending: Obstetrics and Gynecology | Admitting: Obstetrics and Gynecology

## 2021-03-06 ENCOUNTER — Ambulatory Visit: Payer: Self-pay

## 2021-03-06 DIAGNOSIS — R928 Other abnormal and inconclusive findings on diagnostic imaging of breast: Secondary | ICD-10-CM

## 2021-06-12 LAB — OB RESULTS CONSOLE HEPATITIS B SURFACE ANTIGEN: Hepatitis B Surface Ag: NEGATIVE

## 2021-06-12 LAB — OB RESULTS CONSOLE HIV ANTIBODY (ROUTINE TESTING): HIV: NONREACTIVE

## 2021-06-12 LAB — OB RESULTS CONSOLE ABO/RH: RH Type: POSITIVE

## 2021-06-12 LAB — OB RESULTS CONSOLE RUBELLA ANTIBODY, IGM: Rubella: IMMUNE

## 2021-06-12 LAB — HEPATITIS C ANTIBODY: HCV Ab: NEGATIVE

## 2021-06-12 LAB — OB RESULTS CONSOLE RPR: RPR: NONREACTIVE

## 2021-06-12 LAB — OB RESULTS CONSOLE ANTIBODY SCREEN: Antibody Screen: NEGATIVE

## 2021-06-16 LAB — OB RESULTS CONSOLE GC/CHLAMYDIA
Chlamydia: NEGATIVE
Gonorrhea: NEGATIVE

## 2021-06-17 ENCOUNTER — Inpatient Hospital Stay (HOSPITAL_COMMUNITY)
Admission: AD | Admit: 2021-06-17 | Payer: BC Managed Care – PPO | Source: Home / Self Care | Admitting: Obstetrics and Gynecology

## 2021-07-27 NOTE — L&D Delivery Note (Addendum)
Operative Note  PROCEDURE DATE: 12/21/21   PREOPERATIVE DIAGNOSIS: Malpresentation, gestational hypertension, AMA, [redacted]w[redacted]d IVF with donor egg   POSTOPERATIVE DIAGNOSIS: The same   PROCEDURE:    Primary Low Transverse Cesarean Section   SURGEON:  Dr. AAlpha GulaASSISTANT: Dr. TDarron DoomAn experienced assistant was required given the standard of surgical care given the complexity of the case.  This assistant was needed for exposure, dissection, suctioning, retraction, instrument exchange, assisting with delivery with administration of fundal pressure, and for overall help during the procedure.    INDICATIONS: This is a 46y.o. yo [id number] 31w3dequiring cesarean section secondary to malpresentation and failed external cephalic version.   Decision made to proceed with LTCS. The risks of cesarean section discussed with the patient included but were not limited to: bleeding which may require transfusion or reoperation; infection which may require antibiotics; injury to bowel, bladder, ureters or other surrounding organs; injury to the fetus; need for additional procedures including hysterectomy in the event of a life-threatening hemorrhage; placental abnormalities wth subsequent pregnancies, incisional problems, thromboembolic phenomenon and other postoperative/anesthesia complications. The patient agreed with the proposed plan, giving informed consent for the procedure.     FINDINGS:  Viable female infant in transverse, back down presentation, APGARs 2, 9, 9,  Weight pending, Amniotic fluid clear,  Intact placenta, three vessel cord.  Grossly normal uterus  .   ANESTHESIA:    Epidural ESTIMATED BLOOD LOSS: 2877 cc SPECIMENS: Placenta for routine COMPLICATIONS: None immediate    PROCEDURE IN DETAIL:  The patient received intravenous antibiotics (2g Ancef) and had sequential compression devices applied to her lower extremities while in the preoperative area.  She was then taken to the  operating room where epidural anesthesia was dosed up to surgical level and was found to be adequate. She was then placed in a dorsal supine position with a leftward tilt, and prepped and draped in a sterile manner.  A foley catheter was placed into her bladder and attached to constant gravity.  After an adequate timeout was performed, a Pfannenstiel skin incision was made with scalpel and carried through to the underlying layer of fascia. The fascia was incised in the midline and this incision was extended bilaterally using the Mayo scissors. Kocher clamps were applied to the superior aspect of the fascial incision and the underlying rectus muscles were dissected off bluntly. A similar process was carried out on the inferior aspect of the facial incision. The rectus muscles were separated in the midline bluntly and the peritoneum was entered bluntly.  A bladder flap was created sharply and developed bluntly. A transverse hysterotomy was made with a scalpel and extended bilaterally bluntly. At time of hysterotomy, large sinuses with brisk heavy bleeding was noted.  This was clamped with ring forcep but continued to bleed. Amniotomy was then made and baby was transverse, back down.  Due to brisk bleeding and difficulty with fetal position, faculty assistance from Dr PrKennon Roundsas requested for assistance who responded immediately. The infant was successfully delivered, and cord was clamped and cut and infant was handed over to awaiting neonatology team. Uterine massage was then administered and the placenta delivered intact with three-vessel cord. Multiple ring forceps were placed on the hysterotomy. At this point, blood loss was noted to be exceedingly high and transfusion was initiated.  2u pRBC and 2u FFP planned for transfusion. The uterus was cleared of clot and debris.  The hysterotomy was closed with 0-monocryl.  A second imbricating suture of  0-monocryl was used to reinforce the incision and aid in hemostasis.   Hemostasis throughout the field was achieved with cautery and additional arista was placed on the hysterotomy and rectus muscles. The fascia was closed with 0-PDS in a running fashion with good restoration of anatomy.  The subcutaneus tissue was irrigated and was reapproximated using running plain gut.  The skin was closed with 4-0 Vicryl in a subcuticular fashion.  All surgical site and was hemostatic at end of procedure without any further bleeding on exam.    Pt tolerated the procedure well. All sponge/lap/needle counts were correct  X 2. Pt taken to recovery room in stable condition.     Alpha Gula MD

## 2021-12-07 ENCOUNTER — Encounter (HOSPITAL_COMMUNITY): Payer: Self-pay

## 2021-12-07 ENCOUNTER — Inpatient Hospital Stay (HOSPITAL_COMMUNITY)
Admission: AD | Admit: 2021-12-07 | Discharge: 2021-12-07 | Disposition: A | Discharge status: Home / Self Care | Payer: BC Managed Care – PPO | Attending: Obstetrics and Gynecology | Admitting: Obstetrics and Gynecology

## 2021-12-07 DIAGNOSIS — Z3A35 35 weeks gestation of pregnancy: Secondary | ICD-10-CM | POA: Insufficient documentation

## 2021-12-07 DIAGNOSIS — O479 False labor, unspecified: Secondary | ICD-10-CM | POA: Diagnosis not present

## 2021-12-07 DIAGNOSIS — O4703 False labor before 37 completed weeks of gestation, third trimester: Secondary | ICD-10-CM | POA: Diagnosis present

## 2021-12-07 DIAGNOSIS — Z79899 Other long term (current) drug therapy: Secondary | ICD-10-CM | POA: Insufficient documentation

## 2021-12-07 DIAGNOSIS — R030 Elevated blood-pressure reading, without diagnosis of hypertension: Secondary | ICD-10-CM | POA: Diagnosis not present

## 2021-12-07 DIAGNOSIS — O403XX0 Polyhydramnios, third trimester, not applicable or unspecified: Secondary | ICD-10-CM | POA: Diagnosis not present

## 2021-12-07 DIAGNOSIS — Z3689 Encounter for other specified antenatal screening: Secondary | ICD-10-CM

## 2021-12-07 LAB — URINALYSIS, ROUTINE W REFLEX MICROSCOPIC
Bilirubin Urine: NEGATIVE
Glucose, UA: NEGATIVE mg/dL
Hgb urine dipstick: NEGATIVE
Ketones, ur: NEGATIVE mg/dL
Leukocytes,Ua: NEGATIVE
Nitrite: NEGATIVE
Protein, ur: NEGATIVE mg/dL
Specific Gravity, Urine: 1.01 (ref 1.005–1.030)
pH: 7 (ref 5.0–8.0)

## 2021-12-07 LAB — WET PREP, GENITAL
Clue Cells Wet Prep HPF POC: NONE SEEN
Sperm: NONE SEEN
Trich, Wet Prep: NONE SEEN
WBC, Wet Prep HPF POC: <10 (ref <10)
Yeast Wet Prep HPF POC: NONE SEEN

## 2021-12-07 MED ORDER — ONDANSETRON 4 MG PO TBDP
4.0000 mg | ORAL_TABLET | Freq: Once | ORAL | Status: AC
  Administered 2021-12-07: 4 mg via ORAL
  Filled 2021-12-07: qty 1

## 2021-12-07 MED ORDER — MORPHINE SULFATE (PF) 4 MG/ML IV SOLN
2.0000 mg | Freq: Once | INTRAVENOUS | Status: AC
  Administered 2021-12-07: 2 mg via INTRAMUSCULAR
  Filled 2021-12-07: qty 1

## 2021-12-07 NOTE — MAU Provider Note (Signed)
?History  ?  ? ?CSN: [id number] ? ?Arrival date and time: 12/07/21 1024 ? ?46 y.o. [id number] '@35' .3 wks presenting with ctx. Reports onset yesterday. Frequency is q5 min. States they are painful. Rates pain 8/10. Denies VB, LOF, discharge. Denies urinary sx. No recent IC. She is drinking well but is nauseated today and hasn't eaten, feels well hydrated. Reports good FM.  ? ?OB History   ? ? Gravida  ?5  ? Para  ?2  ? Term  ?1  ? Preterm  ?1  ? AB  ?2  ? Living  ?2  ?  ? ? SAB  ?2  ? IAB  ?   ? Ectopic  ?   ? Multiple  ?   ? Live Births  ?2  ?   ?  ?  ? ? ?Past Medical History:  ?Diagnosis Date  ? Anxiety   ? Depression   ? Migraines   ? PONV (postoperative nausea and vomiting)   ? ? ?Past Surgical History:  ?Procedure Laterality Date  ? BREAST BIOPSY    ? BREAST LUMPECTOMY WITH NEEDLE LOCALIZATION Right 12/07/2017  ? Procedure: BREAST LUMPECTOMY WITH NEEDLE LOCALIZATION AND RADIOACTIVE SEED;  Surgeon: Rolm Bookbinder, MD;  Location: [facility];  Service: General;  Laterality: Right;  ? DILATION AND EVACUATION N/A 09/11/2016  ? Procedure: DILATATION AND EVACUATION;  Surgeon: Molli Posey, MD;  Location: [facility];  Service: Gynecology;  Laterality: N/A;  ? SHOULDER ARTHROSCOPY Right   ? SINUS SURGERY WITH INSTATRAK    ? ? ?Family History  ?Problem Relation Age of Onset  ? BRCA 1/2 Mother   ? Parkinson's disease Father   ? ? ?Social History  ? ?Tobacco Use  ? Smoking status: Never  ? Smokeless tobacco: Never  ?Vaping Use  ? Vaping Use: Never used  ?Substance Use Topics  ? Alcohol use: Yes  ?  Comment: none since pregnancy  ? Drug use: No  ? ? ?Allergies: No Known Allergies ? ?Medications Prior to Admission  ?Medication Sig Dispense Refill Last Dose  ? aspirin-acetaminophen-caffeine (EXCEDRIN MIGRAINE) 250-250-65 MG tablet Take 2 tablets by mouth daily as needed for headache.     ? azelastine (ASTELIN) 0.1 % nasal spray Place 1 spray into both nostrils 2 (two) times daily. Use in each nostril as directed 30 mL 0   ?  brompheniramine-pseudoephedrine-DM 30-2-10 MG/5ML syrup Take 10 mLs by mouth 3 (three) times daily as needed. 120 mL 0   ? escitalopram (LEXAPRO) 5 MG tablet Take 5 mg by mouth daily.     ? MELATONIN PO Take 1 tablet by mouth at bedtime as needed (sleep).     ? Multiple Vitamin (MULTIVITAMIN WITH MINERALS) TABS tablet Take 1 tablet by mouth daily.     ? topiramate (TOPAMAX) 25 MG tablet Take 1 tablet (25 mg total) by mouth 2 (two) times daily. (Patient not taking: Reported on 07/21/2018) 60 tablet 6   ? traMADol (ULTRAM) 50 MG tablet Take 2 tablets (100 mg total) by mouth every 6 (six) hours as needed. (Patient not taking: Reported on 07/21/2018) 10 tablet 0   ? ? ?Review of Systems  ?Gastrointestinal:  Positive for abdominal pain (ctx).  ?Genitourinary:  Negative for dysuria, hematuria, urgency, vaginal bleeding and vaginal discharge.  ?Physical Exam  ? ?Blood pressure 117/85, pulse 93, temperature 98.1 ?F (36.7 ?C), temperature source Oral, resp. rate 16, last menstrual period 02/20/2021, SpO2 95 %, unknown if currently breastfeeding. ?Patient Vitals for the past 24 hrs: ?  BP Temp Temp src Pulse Resp SpO2  ?12/07/21 1200 117/85 -- -- 93 -- 95 %  ?12/07/21 1145 106/81 -- -- 98 -- 96 %  ?12/07/21 1130 122/86 -- -- (!) 101 -- 94 %  ?12/07/21 1107 133/90 98.1 ?F (36.7 ?C) Oral 94 16 97 %  ? ? ?Physical Exam ?Vitals and nursing note reviewed. Exam conducted with a chaperone present.  ?Constitutional:   ?   General: She is not in acute distress. ?   Appearance: Normal appearance.  ?HENT:  ?   Head: Normocephalic and atraumatic.  ?Cardiovascular:  ?   Rate and Rhythm: Normal rate.  ?Pulmonary:  ?   Effort: Pulmonary effort is normal. No respiratory distress.  ?Abdominal:  ?   Palpations: Abdomen is soft.  ?   Tenderness: There is no abdominal tenderness.  ?   Comments: gravid  ?Genitourinary: ?   Comments: VE: closed/thick ?Musculoskeletal:     ?   General: Normal range of motion.  ?   Cervical back: Normal range of  motion.  ?Skin: ?   General: Skin is warm and dry.  ?Neurological:  ?   General: No focal deficit present.  ?   Mental Status: She is alert and oriented to person, place, and time.  ?Psychiatric:     ?   Mood and Affect: Mood normal.     ?   Behavior: Behavior normal.  ?EFM: 150 bpm, mod variability, + accels, no decels ?Toco: UI ? ?Results for orders placed or performed during the hospital encounter of 12/07/21 (from the past 24 hour(s))  ?Urinalysis, Routine w reflex microscopic Urine, Clean Catch     Status: Abnormal  ? Collection Time: 12/07/21 11:12 AM  ?Result Value Ref Range  ? Color, Urine YELLOW YELLOW  ? APPearance HAZY (A) CLEAR  ? Specific Gravity, Urine 1.010 1.005 - 1.030  ? pH 7.0 5.0 - 8.0  ? Glucose, UA NEGATIVE NEGATIVE mg/dL  ? Hgb urine dipstick NEGATIVE NEGATIVE  ? Bilirubin Urine NEGATIVE NEGATIVE  ? Ketones, ur NEGATIVE NEGATIVE mg/dL  ? Protein, ur NEGATIVE NEGATIVE mg/dL  ? Nitrite NEGATIVE NEGATIVE  ? Leukocytes,Ua NEGATIVE NEGATIVE  ?Wet prep, genital     Status: None  ? Collection Time: 12/07/21 11:26 AM  ? Specimen: Vaginal  ?Result Value Ref Range  ? Yeast Wet Prep HPF POC NONE SEEN NONE SEEN  ? Trich, Wet Prep NONE SEEN NONE SEEN  ? Clue Cells Wet Prep HPF POC NONE SEEN NONE SEEN  ? WBC, Wet Prep HPF POC <10 <10  ? Sperm NONE SEEN   ? ?MAU Course  ?Procedures ?Morphine ?Zofran ? ?MDM ?Prenatal records reviewed: pregnancy complicated by AMA, IVF, polyhydramnios (per pt), hx PPH, hx gHTN, anxiety/depression.  ?Nausea and pain improved but states frequency is same. Ctx irregular on toco but offered Procardia for comfort. Pt declines. Cervix unchanged. Informed polyhydramnios may cause more frequent ctx but given strict PTL precautions. ?BP elevated on intake then normotensive. Discussed PEC precautions.  ?Stable for discharge. ?Assessment and Plan  ? ?1. [redacted] weeks gestation of pregnancy   ?2. Braxton Hicks contractions   ?3. NST (non-stress test) reactive   ? ?Discharge home ?Follow up at  [facility] this week as scheduled ?PTL precautions ? ?Allergies as of 12/07/2021   ?No Known Allergies ?  ? ?  ?Medication List  ?  ? ?STOP taking these medications   ? ?aspirin-acetaminophen-caffeine 250-250-65 MG tablet ?Commonly known as: EXCEDRIN MIGRAINE ?  ?azelastine  0.1 % nasal spray ?Commonly known as: ASTELIN ?  ?brompheniramine-pseudoephedrine-DM 30-2-10 MG/5ML syrup ?  ?MELATONIN PO ?  ?topiramate 25 MG tablet ?Commonly known as: TOPAMAX ?  ?traMADol 50 MG tablet ?Commonly known as: ULTRAM ?  ? ?  ? ?TAKE these medications   ? ?escitalopram 5 MG tablet ?Commonly known as: LEXAPRO ?Take 5 mg by mouth daily. ?  ?multivitamin with minerals Tabs tablet ?Take 1 tablet by mouth daily. ?  ? ?  ? ?Julianne Handler, CNM ?12/07/2021, 12:35 PM  ?

## 2021-12-07 NOTE — MAU Note (Signed)
.  Miranda [name] is a 46 y.o. at 52w3dhere in MAU reporting: ctx that started yesterday. States they are every 5 minutes and more intense today. Denies recent intercourse, VB or LOF. +FM.  ? ?Pain score: 8 ?Vitals:  ? 12/07/21 1107  ?BP: 133/90  ?Pulse: 94  ?Resp: 16  ?Temp: 98.1 ?F (36.7 ?C)  ?SpO2: 97%  ?   ?FHT:148 ?Lab orders placed from triage:  UA ?

## 2021-12-08 ENCOUNTER — Inpatient Hospital Stay (HOSPITAL_COMMUNITY)
Admission: AD | Admit: 2021-12-08 | Discharge: 2021-12-08 | Disposition: A | Discharge status: Home / Self Care | Payer: BC Managed Care – PPO | Attending: Obstetrics and Gynecology | Admitting: Obstetrics and Gynecology

## 2021-12-08 ENCOUNTER — Encounter (HOSPITAL_COMMUNITY): Payer: Self-pay | Admitting: Obstetrics and Gynecology

## 2021-12-08 DIAGNOSIS — O26893 Other specified pregnancy related conditions, third trimester: Secondary | ICD-10-CM | POA: Insufficient documentation

## 2021-12-08 DIAGNOSIS — O09523 Supervision of elderly multigravida, third trimester: Secondary | ICD-10-CM | POA: Insufficient documentation

## 2021-12-08 DIAGNOSIS — R030 Elevated blood-pressure reading, without diagnosis of hypertension: Secondary | ICD-10-CM | POA: Diagnosis not present

## 2021-12-08 DIAGNOSIS — O09293 Supervision of pregnancy with other poor reproductive or obstetric history, third trimester: Secondary | ICD-10-CM | POA: Insufficient documentation

## 2021-12-08 DIAGNOSIS — O403XX0 Polyhydramnios, third trimester, not applicable or unspecified: Secondary | ICD-10-CM | POA: Diagnosis not present

## 2021-12-08 DIAGNOSIS — Z3A35 35 weeks gestation of pregnancy: Secondary | ICD-10-CM | POA: Diagnosis not present

## 2021-12-08 DIAGNOSIS — R1013 Epigastric pain: Secondary | ICD-10-CM | POA: Diagnosis not present

## 2021-12-08 DIAGNOSIS — R519 Headache, unspecified: Secondary | ICD-10-CM | POA: Insufficient documentation

## 2021-12-08 DIAGNOSIS — R1011 Right upper quadrant pain: Secondary | ICD-10-CM | POA: Diagnosis not present

## 2021-12-08 LAB — COMPREHENSIVE METABOLIC PANEL
ALT: 18 U/L (ref 0–44)
AST: 19 U/L (ref 15–41)
Albumin: 2.8 g/dL — ABNORMAL LOW (ref 3.5–5.0)
Alkaline Phosphatase: 142 U/L — ABNORMAL HIGH (ref 38–126)
Anion gap: 9 (ref 5–15)
BUN: 7 mg/dL (ref 6–20)
CO2: 19 mmol/L — ABNORMAL LOW (ref 22–32)
Calcium: 8.8 mg/dL — ABNORMAL LOW (ref 8.9–10.3)
Chloride: 107 mmol/L (ref 98–111)
Creatinine, Ser: 0.57 mg/dL (ref 0.44–1.00)
GFR, Estimated: >60 mL/min (ref >60)
Glucose, Bld: 89 mg/dL (ref 70–99)
Potassium: 3.9 mmol/L (ref 3.5–5.1)
Sodium: 135 mmol/L (ref 135–145)
Total Bilirubin: 0.3 mg/dL (ref 0.3–1.2)
Total Protein: 5.5 g/dL — ABNORMAL LOW (ref 6.5–8.1)

## 2021-12-08 LAB — CBC
HCT: 38.1 % (ref 36.0–46.0)
Hemoglobin: 13.2 g/dL (ref 12.0–15.0)
MCH: 32.3 pg (ref 26.0–34.0)
MCHC: 34.6 g/dL (ref 30.0–36.0)
MCV: 93.2 fL (ref 80.0–100.0)
Platelets: 214 10*3/uL (ref 150–400)
RBC: 4.09 MIL/uL (ref 3.87–5.11)
RDW: 12.4 % (ref 11.5–15.5)
WBC: 13.4 10*3/uL — ABNORMAL HIGH (ref 4.0–10.5)
nRBC: 0 % (ref 0.0–0.2)

## 2021-12-08 LAB — PROTEIN / CREATININE RATIO, URINE
Creatinine, Urine: 55.94 mg/dL
Protein Creatinine Ratio: 0.21 mg/mg{Cre} — ABNORMAL HIGH (ref 0.00–0.15)
Total Protein, Urine: 12 mg/dL

## 2021-12-08 LAB — GC/CHLAMYDIA PROBE AMP (~~LOC~~) NOT AT ARMC
Chlamydia: NEGATIVE
Comment: NEGATIVE
Comment: NORMAL
Neisseria Gonorrhea: NEGATIVE

## 2021-12-08 MED ORDER — DIPHENHYDRAMINE HCL 25 MG PO CAPS
25.0000 mg | ORAL_CAPSULE | Freq: Once | ORAL | Status: AC
  Administered 2021-12-08: 25 mg via ORAL
  Filled 2021-12-08: qty 1

## 2021-12-08 MED ORDER — CAFFEINE 200 MG PO TABS
200.0000 mg | ORAL_TABLET | Freq: Once | ORAL | Status: AC
  Administered 2021-12-08: 200 mg via ORAL
  Filled 2021-12-08: qty 1

## 2021-12-08 MED ORDER — METOCLOPRAMIDE HCL 10 MG PO TABS
10.0000 mg | ORAL_TABLET | Freq: Once | ORAL | Status: AC
Start: 2021-12-08 — End: 2021-12-08
  Administered 2021-12-08: 10 mg via ORAL
  Filled 2021-12-08: qty 1

## 2021-12-08 NOTE — MAU Note (Signed)
.  Miranda [name] is a 46 y.o. at 69w4dhere in MAU reporting: started having a headache last night, she states she took tylenol at 1200 with no relief. Reports sharp epigastric pain and seeing floaters in her vision. Denies VB or LOF. +FM.  ? ?Pain score: 8 ? ?FHT:148 ? ?

## 2021-12-08 NOTE — MAU Provider Note (Signed)
?History  ?  ? ?CSN: [id number] ? ?Arrival date and time: 12/08/21 1613 ? ? Event Date/Time  ? First Provider Initiated Contact with Patient 12/08/21 1658   ?  ? ?Chief Complaint  ?Patient presents with  ? Hypertension  ? Headache  ? ?HPI ?This is a 46 year old G5 P1-1-2-2 at 35 weeks and 4 days with a pregnancy was complicated by IUI.  She has a history of preeclampsia in prior pregnancy.  She presents with headache last couple of days with floaters in her vision.  She also reports sharp epigastric pain.  She took Tylenol without improvement.  She went to the office for evaluation and had a blood pressure of 142/90.  No other palliating factors.  She reports good fetal movement. ? ?OB History   ? ? Gravida  ?5  ? Para  ?2  ? Term  ?1  ? Preterm  ?1  ? AB  ?2  ? Living  ?2  ?  ? ? SAB  ?2  ? IAB  ?   ? Ectopic  ?   ? Multiple  ?   ? Live Births  ?2  ?   ?  ?  ? ? ?Past Medical History:  ?Diagnosis Date  ? Anxiety   ? Depression   ? Migraines   ? PONV (postoperative nausea and vomiting)   ? ? ?Past Surgical History:  ?Procedure Laterality Date  ? BREAST BIOPSY    ? BREAST LUMPECTOMY WITH NEEDLE LOCALIZATION Right 12/07/2017  ? Procedure: BREAST LUMPECTOMY WITH NEEDLE LOCALIZATION AND RADIOACTIVE SEED;  Surgeon: Rolm Bookbinder, MD;  Location: [facility];  Service: General;  Laterality: Right;  ? DILATION AND EVACUATION N/A 09/11/2016  ? Procedure: DILATATION AND EVACUATION;  Surgeon: Molli Posey, MD;  Location: [facility];  Service: Gynecology;  Laterality: N/A;  ? SHOULDER ARTHROSCOPY Right   ? SINUS SURGERY WITH INSTATRAK    ? ? ?Family History  ?Problem Relation Age of Onset  ? BRCA 1/2 Mother   ? Parkinson's disease Father   ? ? ?Social History  ? ?Tobacco Use  ? Smoking status: Never  ? Smokeless tobacco: Never  ?Vaping Use  ? Vaping Use: Never used  ?Substance Use Topics  ? Alcohol use: Yes  ?  Comment: none since pregnancy  ? Drug use: No  ? ? ?Allergies: No Known Allergies ? ?Medications Prior to Admission   ?Medication Sig Dispense Refill Last Dose  ? acetaminophen (TYLENOL) 500 MG tablet Take 1,000 mg by mouth every 6 (six) hours as needed.   12/08/2021 at 1200  ? escitalopram (LEXAPRO) 5 MG tablet Take 5 mg by mouth daily.   12/08/2021  ? Multiple Vitamin (MULTIVITAMIN WITH MINERALS) TABS tablet Take 1 tablet by mouth daily.   12/08/2021  ? ? ?Review of Systems ?Physical Exam  ? ?Blood pressure 130/89, pulse 88, temperature 98.1 ?F (36.7 ?C), temperature source Oral, resp. rate 17, last menstrual period 02/20/2021, SpO2 96 %, unknown if currently breastfeeding. ? ?Patient Vitals for the past 24 hrs: ? BP Temp Temp src Pulse Resp SpO2  ?12/08/21 1915 130/89 -- -- 88 -- 96 %  ?12/08/21 1900 129/80 -- -- 89 -- 97 %  ?12/08/21 1845 (!) 122/101 -- -- 96 -- 96 %  ?12/08/21 1831 (!) 121/100 -- -- 98 -- --  ?12/08/21 1830 (!) 121/100 -- -- 98 -- 96 %  ?12/08/21 1815 126/85 -- -- 91 -- 95 %  ?12/08/21 1800 129/85 -- -- 96 -- 97 %  ?  12/08/21 1756 116/78 -- -- 100 -- --  ?12/08/21 1745 134/85 -- -- 100 -- 98 %  ?12/08/21 1730 130/85 -- -- 93 -- 96 %  ?12/08/21 1716 139/82 -- -- (!) 104 -- --  ?12/08/21 1645 118/82 -- -- 97 -- 94 %  ?12/08/21 1638 129/89 98.1 ?F (36.7 ?C) Oral (!) 103 17 95 %  ? ? ? ?Physical Exam ?Vitals reviewed.  ?Constitutional:   ?   Appearance: She is well-developed.  ?HENT:  ?   Head: Normocephalic and atraumatic.  ?Pulmonary:  ?   Effort: Pulmonary effort is normal.  ?Abdominal:  ?   Palpations: Abdomen is soft.  ?   Tenderness: There is abdominal tenderness (epigastric).  ?Neurological:  ?   Mental Status: She is alert and oriented to person, place, and time.  ?Psychiatric:     ?   Mood and Affect: Mood normal.     ?   Speech: Speech normal.     ?   Behavior: Behavior normal.  ? ?Results for orders placed or performed during the hospital encounter of 12/08/21 (from the past 24 hour(s))  ?CBC     Status: Abnormal  ? Collection Time: 12/08/21  4:53 PM  ?Result Value Ref Range  ? WBC 13.4 (H) 4.0 - 10.5 K/uL   ? RBC 4.09 3.87 - 5.11 MIL/uL  ? Hemoglobin 13.2 12.0 - 15.0 g/dL  ? HCT 38.1 36.0 - 46.0 %  ? MCV 93.2 80.0 - 100.0 fL  ? MCH 32.3 26.0 - 34.0 pg  ? MCHC 34.6 30.0 - 36.0 g/dL  ? RDW 12.4 11.5 - 15.5 %  ? Platelets 214 150 - 400 K/uL  ? nRBC 0.0 0.0 - 0.2 %  ?Comprehensive metabolic panel     Status: Abnormal  ? Collection Time: 12/08/21  4:53 PM  ?Result Value Ref Range  ? Sodium 135 135 - 145 mmol/L  ? Potassium 3.9 3.5 - 5.1 mmol/L  ? Chloride 107 98 - 111 mmol/L  ? CO2 19 (L) 22 - 32 mmol/L  ? Glucose, Bld 89 70 - 99 mg/dL  ? BUN 7 6 - 20 mg/dL  ? Creatinine, Ser 0.57 0.44 - 1.00 mg/dL  ? Calcium 8.8 (L) 8.9 - 10.3 mg/dL  ? Total Protein 5.5 (L) 6.5 - 8.1 g/dL  ? Albumin 2.8 (L) 3.5 - 5.0 g/dL  ? AST 19 15 - 41 U/L  ? ALT 18 0 - 44 U/L  ? Alkaline Phosphatase 142 (H) 38 - 126 U/L  ? Total Bilirubin 0.3 0.3 - 1.2 mg/dL  ? GFR, Estimated >60 >60 mL/min  ? Anion gap 9 5 - 15  ?Protein / creatinine ratio, urine     Status: Abnormal  ? Collection Time: 12/08/21  5:52 PM  ?Result Value Ref Range  ? Creatinine, Urine 55.94 mg/dL  ? Total Protein, Urine 12 mg/dL  ? Protein Creatinine Ratio 0.21 (H) 0.00 - 0.15 mg/mg[Cre]  ? ? ? ?MAU Course  ?Procedures ?NST:  ?Baseline: 130  ?Variability: moderate ?Accelerations: ++  ?Decelerations: none ?Contractions:  ? ? ?MDM ?1700 is Benadryl and Reglan for headache.  Check CBC, CMP, urine protein creatinine ratio. ? ?Assessment and Plan  ? ?1. [redacted] weeks gestation of pregnancy   ?2. Elevated BP without diagnosis of hypertension   ?3. Polyhydramnios affecting pregnancy in third trimester   ?4. Multigravida of advanced maternal age in third trimester   ? ?Headache improved after medications and caffeine.  Still having some mild  right upper quadrant pain.  LFTs are all normal.  Patient has follow-up appointment later this week with primary OB.  I did speak with Dr. Corinna Capra of [facility] in the morning agrees with management and assistance follow-up for the patient. ? ?Truett Mainland ?12/08/2021, 7:24 PM  ?

## 2021-12-10 ENCOUNTER — Encounter (HOSPITAL_COMMUNITY): Payer: Self-pay | Admitting: Obstetrics and Gynecology

## 2021-12-10 ENCOUNTER — Inpatient Hospital Stay (HOSPITAL_COMMUNITY)
Admission: AD | Admit: 2021-12-10 | Discharge: 2021-12-10 | Disposition: A | Discharge status: Home / Self Care | Payer: BC Managed Care – PPO | Attending: Obstetrics and Gynecology | Admitting: Obstetrics and Gynecology

## 2021-12-10 ENCOUNTER — Other Ambulatory Visit: Payer: Self-pay

## 2021-12-10 DIAGNOSIS — O133 Gestational [pregnancy-induced] hypertension without significant proteinuria, third trimester: Secondary | ICD-10-CM | POA: Diagnosis present

## 2021-12-10 DIAGNOSIS — Z3689 Encounter for other specified antenatal screening: Secondary | ICD-10-CM

## 2021-12-10 DIAGNOSIS — Z3A35 35 weeks gestation of pregnancy: Secondary | ICD-10-CM | POA: Insufficient documentation

## 2021-12-10 LAB — PROTEIN / CREATININE RATIO, URINE
Creatinine, Urine: 80.84 mg/dL
Protein Creatinine Ratio: 0.14 mg/mg{Cre} (ref 0.00–0.15)
Total Protein, Urine: 11 mg/dL

## 2021-12-10 LAB — CBC
HCT: 36.3 % (ref 36.0–46.0)
Hemoglobin: 12.9 g/dL (ref 12.0–15.0)
MCH: 33.2 pg (ref 26.0–34.0)
MCHC: 35.5 g/dL (ref 30.0–36.0)
MCV: 93.3 fL (ref 80.0–100.0)
Platelets: 207 10*3/uL (ref 150–400)
RBC: 3.89 MIL/uL (ref 3.87–5.11)
RDW: 12.5 % (ref 11.5–15.5)
WBC: 12.5 10*3/uL — ABNORMAL HIGH (ref 4.0–10.5)
nRBC: 0 % (ref 0.0–0.2)

## 2021-12-10 LAB — URINALYSIS, ROUTINE W REFLEX MICROSCOPIC
Bilirubin Urine: NEGATIVE
Glucose, UA: NEGATIVE mg/dL
Hgb urine dipstick: NEGATIVE
Ketones, ur: NEGATIVE mg/dL
Leukocytes,Ua: NEGATIVE
Nitrite: NEGATIVE
Protein, ur: NEGATIVE mg/dL
Specific Gravity, Urine: 1.013 (ref 1.005–1.030)
pH: 6 (ref 5.0–8.0)

## 2021-12-10 LAB — COMPREHENSIVE METABOLIC PANEL
ALT: 16 U/L (ref 0–44)
AST: 20 U/L (ref 15–41)
Albumin: 2.7 g/dL — ABNORMAL LOW (ref 3.5–5.0)
Alkaline Phosphatase: 142 U/L — ABNORMAL HIGH (ref 38–126)
Anion gap: 10 (ref 5–15)
BUN: 7 mg/dL (ref 6–20)
CO2: 19 mmol/L — ABNORMAL LOW (ref 22–32)
Calcium: 8.8 mg/dL — ABNORMAL LOW (ref 8.9–10.3)
Chloride: 107 mmol/L (ref 98–111)
Creatinine, Ser: 0.73 mg/dL (ref 0.44–1.00)
GFR, Estimated: >60 mL/min (ref >60)
Glucose, Bld: 79 mg/dL (ref 70–99)
Potassium: 3.9 mmol/L (ref 3.5–5.1)
Sodium: 136 mmol/L (ref 135–145)
Total Bilirubin: 0.3 mg/dL (ref 0.3–1.2)
Total Protein: 5.4 g/dL — ABNORMAL LOW (ref 6.5–8.1)

## 2021-12-10 MED ORDER — BUTALBITAL-APAP-CAFFEINE 50-325-40 MG PO TABS
1.0000 | ORAL_TABLET | Freq: Once | ORAL | Status: AC
  Administered 2021-12-10: 1 via ORAL
  Filled 2021-12-10: qty 1

## 2021-12-10 MED ORDER — ONDANSETRON 4 MG PO TBDP
4.0000 mg | ORAL_TABLET | Freq: Once | ORAL | Status: AC
  Administered 2021-12-10: 4 mg via ORAL
  Filled 2021-12-10: qty 1

## 2021-12-10 MED ORDER — ONDANSETRON 4 MG PO TBDP: 4.0000 mg | ORAL_TABLET | Freq: Three times a day (TID) | ORAL | 0 refills | Status: DC | PRN

## 2021-12-10 NOTE — MAU Note (Signed)
Miranda [name] is a 46 y.o. at 37w6dhere in MAU reporting: she was sent from MD office for BP evaluation.  Endorses H/A, epigastric pain, and visual disturbances.  States has taken Tylenol for H/A, no relief noted.  Also reporting increased swelling in feet bilaterally. ? ?Onset of complaint: 4 days ?Pain score: 7/10 ?Vitals:  ? 12/10/21 1616  ?BP: 130/82  ?Pulse: 91  ?Resp: 19  ?Temp: 99 ?F (37.2 ?C)  ?SpO2: 96%  ?   ?FHT:+FM.  Denies LOF or VB ?Lab orders placed from triage:   UA ?

## 2021-12-10 NOTE — MAU Provider Note (Signed)
?History  ?  ? ?CSN: [id number] ? ?Arrival date and time: 12/10/21 1601 ? ? Event Date/Time  ? First Provider Initiated Contact with Patient 12/10/21 1654   ?  ? ?Chief Complaint  ?Patient presents with  ? Headache  ? BP Evaluation  ? ?46 y.o. [id number] _0 .6 wks sent from office for HA and elevated BP. Reports HA started 3 days ago. She has tried Tylenol but it didn't help. Rates HA pain 7/10. Endorses intermittent floaters and RUQ pain today. Reports good FM. Denies ctx, LOF, VB. Of note, she was seen 2 days ago in MAU for elevated BP but did not meet criteria for dx.  ? ?OB History   ? ? Gravida  ?5  ? Para  ?2  ? Term  ?1  ? Preterm  ?1  ? AB  ?2  ? Living  ?2  ?  ? ? SAB  ?2  ? IAB  ?   ? Ectopic  ?   ? Multiple  ?   ? Live Births  ?2  ?   ?  ?  ? ? ?Past Medical History:  ?Diagnosis Date  ? Anxiety   ? Depression   ? Migraines   ? PONV (postoperative nausea and vomiting)   ? ? ?Past Surgical History:  ?Procedure Laterality Date  ? BREAST BIOPSY    ? BREAST LUMPECTOMY WITH NEEDLE LOCALIZATION Right 12/07/2017  ? Procedure: BREAST LUMPECTOMY WITH NEEDLE LOCALIZATION AND RADIOACTIVE SEED;  Surgeon: Rolm Bookbinder, MD;  Location: [facility];  Service: General;  Laterality: Right;  ? DILATION AND EVACUATION N/A 09/11/2016  ? Procedure: DILATATION AND EVACUATION;  Surgeon: Molli Posey, MD;  Location: [facility];  Service: Gynecology;  Laterality: N/A;  ? SHOULDER ARTHROSCOPY Right   ? SINUS SURGERY WITH INSTATRAK    ? ? ?Family History  ?Problem Relation Age of Onset  ? BRCA 1/2 Mother   ? Parkinson's disease Father   ? ? ?Social History  ? ?Tobacco Use  ? Smoking status: Never  ? Smokeless tobacco: Never  ?Vaping Use  ? Vaping Use: Never used  ?Substance Use Topics  ? Alcohol use: Yes  ?  Comment: none since pregnancy  ? Drug use: No  ? ? ?Allergies: No Known Allergies ? ?Medications Prior to Admission  ?Medication Sig Dispense Refill Last Dose  ? acetaminophen (TYLENOL) 500 MG tablet Take 1,000 mg by mouth every 6 (six)  hours as needed.   12/10/2021  ? escitalopram (LEXAPRO) 5 MG tablet Take 5 mg by mouth daily.   12/10/2021  ? Multiple Vitamin (MULTIVITAMIN WITH MINERALS) TABS tablet Take 1 tablet by mouth daily.   12/10/2021  ? ? ?Review of Systems  ?Eyes:  Positive for visual disturbance.  ?Respiratory:  Negative for shortness of breath.   ?Cardiovascular:  Positive for leg swelling. Negative for chest pain.  ?Gastrointestinal:  Positive for abdominal pain and nausea. Negative for vomiting.  ?Neurological:  Positive for headaches.  ?Physical Exam  ? ?Blood pressure 121/90, pulse 89, temperature 99 ?F (37.2 ?C), temperature source Oral, resp. rate 19, height _1  (1.676 m), weight 96.2 kg, last menstrual period 02/20/2021, SpO2 97 %, unknown if currently breastfeeding. ?Patient Vitals for the past 24 hrs: ? BP Temp Temp src Pulse Resp SpO2 Height Weight  ?12/10/21 1800 121/90 -- -- 89 -- -- -- --  ?12/10/21 1746 129/78 -- -- 96 -- -- -- --  ?12/10/21 1730 134/76 -- -- 89 -- 97 % -- --  ?  12/10/21 1725 -- -- -- -- -- 97 % -- --  ?12/10/21 1720 -- -- -- -- -- 97 % -- --  ?12/10/21 1715 120/83 -- -- 97 -- 96 % -- --  ?12/10/21 1710 -- -- -- -- -- 97 % -- --  ?12/10/21 1705 -- -- -- -- -- 97 % -- --  ?12/10/21 1700 134/83 -- -- 86 -- 97 % -- --  ?12/10/21 1655 122/85 -- -- 92 -- 96 % -- --  ?12/10/21 1650 -- -- -- -- -- 96 % -- --  ?12/10/21 1645 -- -- -- -- -- 96 % -- --  ?12/10/21 1640 -- -- -- -- -- 97 % -- --  ?12/10/21 1637 127/75 -- -- 88 -- -- -- --  ?12/10/21 1616 130/82 99 ?F (37.2 ?C) Oral 91 19 96 % -- --  ?12/10/21 1611 -- -- -- -- -- -- _0  (1.676 m) 96.2 kg  ? ?Physical Exam ?Vitals and nursing note reviewed.  ?Constitutional:   ?   General: She is not in acute distress. ?   Appearance: Normal appearance.  ?HENT:  ?   Head: Normocephalic and atraumatic.  ?Pulmonary:  ?   Effort: Pulmonary effort is normal. No respiratory distress.  ?Musculoskeletal:  ?   Cervical back: Normal range of motion.  ?   Right lower leg: Edema  (1+) present.  ?   Left lower leg: Edema (1+) present.  ?Skin: ?   General: Skin is warm and dry.  ?Neurological:  ?   General: No focal deficit present.  ?   Mental Status: She is alert.  ?   Cranial Nerves: No cranial nerve deficit.  ?Psychiatric:     ?   Mood and Affect: Mood normal.     ?   Behavior: Behavior normal.  ?EFM: 140 bpm, mod variability, + accels, no decels ?Toco: rare, UI ? ?Results for orders placed or performed during the hospital encounter of 12/10/21 (from the past 24 hour(s))  ?CBC     Status: Abnormal  ? Collection Time: 12/10/21  4:42 PM  ?Result Value Ref Range  ? WBC 12.5 (H) 4.0 - 10.5 K/uL  ? RBC 3.89 3.87 - 5.11 MIL/uL  ? Hemoglobin 12.9 12.0 - 15.0 g/dL  ? HCT 36.3 36.0 - 46.0 %  ? MCV 93.3 80.0 - 100.0 fL  ? MCH 33.2 26.0 - 34.0 pg  ? MCHC 35.5 30.0 - 36.0 g/dL  ? RDW 12.5 11.5 - 15.5 %  ? Platelets 207 150 - 400 K/uL  ? nRBC 0.0 0.0 - 0.2 %  ?Comprehensive metabolic panel     Status: Abnormal  ? Collection Time: 12/10/21  4:42 PM  ?Result Value Ref Range  ? Sodium 136 135 - 145 mmol/L  ? Potassium 3.9 3.5 - 5.1 mmol/L  ? Chloride 107 98 - 111 mmol/L  ? CO2 19 (L) 22 - 32 mmol/L  ? Glucose, Bld 79 70 - 99 mg/dL  ? BUN 7 6 - 20 mg/dL  ? Creatinine, Ser 0.73 0.44 - 1.00 mg/dL  ? Calcium 8.8 (L) 8.9 - 10.3 mg/dL  ? Total Protein 5.4 (L) 6.5 - 8.1 g/dL  ? Albumin 2.7 (L) 3.5 - 5.0 g/dL  ? AST 20 15 - 41 U/L  ? ALT 16 0 - 44 U/L  ? Alkaline Phosphatase 142 (H) 38 - 126 U/L  ? Total Bilirubin 0.3 0.3 - 1.2 mg/dL  ? GFR, Estimated >60 >60 mL/min  ? Anion gap  10 5 - 15  ?Urinalysis, Routine w reflex microscopic Urine, Clean Catch     Status: Abnormal  ? Collection Time: 12/10/21  5:06 PM  ?Result Value Ref Range  ? Color, Urine YELLOW YELLOW  ? APPearance HAZY (A) CLEAR  ? Specific Gravity, Urine 1.013 1.005 - 1.030  ? pH 6.0 5.0 - 8.0  ? Glucose, UA NEGATIVE NEGATIVE mg/dL  ? Hgb urine dipstick NEGATIVE NEGATIVE  ? Bilirubin Urine NEGATIVE NEGATIVE  ? Ketones, ur NEGATIVE NEGATIVE mg/dL  ?  Protein, ur NEGATIVE NEGATIVE mg/dL  ? Nitrite NEGATIVE NEGATIVE  ? Leukocytes,Ua NEGATIVE NEGATIVE  ?Protein / creatinine ratio, urine     Status: None  ? Collection Time: 12/10/21  5:06 PM  ?Result Value Ref Range  ? Creatinine, Urine 80.84 mg/dL  ? Total Protein, Urine 11 mg/dL  ? Protein Creatinine Ratio 0.14 0.00 - 0.15 mg/mg[Cre]  ? ?MAU Course  ?Procedures ?Fioricet ? ?MDM ?Prenatal records reviewed: pregnancy complicated by AMA, IVF, polyhydramnios, hx PPH, hx gHTN, and anxiety/depression. ?Labs ordered and reviewed. HA and nausea improved. Meets criteria for gHTN, no signs of severe features. Consult with Dr. Harolyn Rutherford, plan for outpt mmgt. Discussed results and plan with pt, she will f/u in office tomorrow, has appt. She is stable for discharge home.  ? ?Assessment and Plan  ? ?1. [redacted] weeks gestation of pregnancy   ?2. NST (non-stress test) reactive   ?3. Gestational hypertension, third trimester   ? ?Discharge home ?Follow up at [facility] tomorrow ?Strict PEC precautions ?Rx Zofran ? ?Allergies as of 12/10/2021   ?No Known Allergies ?  ? ?  ?Medication List  ?  ? ?TAKE these medications   ? ?acetaminophen 500 MG tablet ?Commonly known as: TYLENOL ?Take 1,000 mg by mouth every 6 (six) hours as needed. ?  ?escitalopram 5 MG tablet ?Commonly known as: LEXAPRO ?Take 5 mg by mouth daily. ?  ?multivitamin with minerals Tabs tablet ?Take 1 tablet by mouth daily. ?  ?ondansetron 4 MG disintegrating tablet ?Commonly known as: ZOFRAN-ODT ?Take 1 tablet (4 mg total) by mouth every 8 (eight) hours as needed for nausea or vomiting. ?  ? ?  ? ? ?Julianne Handler, CNM ?12/10/2021, 6:22 PM  ?

## 2021-12-11 ENCOUNTER — Encounter (HOSPITAL_COMMUNITY): Payer: Self-pay | Admitting: *Deleted

## 2021-12-11 ENCOUNTER — Telehealth (HOSPITAL_COMMUNITY): Payer: Self-pay | Admitting: *Deleted

## 2021-12-11 NOTE — Telephone Encounter (Signed)
Preadmission screen  

## 2021-12-16 MED ORDER — TERBUTALINE SULFATE 1 MG/ML IJ SOLN
0.2500 mg | Freq: Once | INTRAMUSCULAR | Status: AC | PRN
  Administered 2021-12-21: 0.25 mg via SUBCUTANEOUS

## 2021-12-21 ENCOUNTER — Inpatient Hospital Stay (HOSPITAL_COMMUNITY)
Admission: AD | Admit: 2021-12-21 | Discharge: 2021-12-24 | DRG: 787 | Disposition: A | Discharge status: Home / Self Care | Payer: BC Managed Care – PPO | Attending: Obstetrics and Gynecology | Admitting: Obstetrics and Gynecology

## 2021-12-21 ENCOUNTER — Inpatient Hospital Stay (HOSPITAL_COMMUNITY): Payer: BC Managed Care – PPO | Admitting: Anesthesiology

## 2021-12-21 ENCOUNTER — Other Ambulatory Visit: Payer: Self-pay

## 2021-12-21 ENCOUNTER — Encounter (HOSPITAL_COMMUNITY): Payer: BC Managed Care – PPO

## 2021-12-21 ENCOUNTER — Encounter (HOSPITAL_COMMUNITY)
Admission: AD | Disposition: A | Discharge status: Home / Self Care | Payer: Self-pay | Source: Home / Self Care | Attending: Obstetrics and Gynecology

## 2021-12-21 ENCOUNTER — Encounter (HOSPITAL_COMMUNITY): Payer: Self-pay | Admitting: Obstetrics and Gynecology

## 2021-12-21 ENCOUNTER — Inpatient Hospital Stay (HOSPITAL_COMMUNITY): Payer: BC Managed Care – PPO

## 2021-12-21 ENCOUNTER — Observation Stay (HOSPITAL_COMMUNITY)
Admission: RE | Admit: 2021-12-21 | Discharge: 2021-12-21 | Disposition: A | Discharge status: Home / Self Care | Payer: BC Managed Care – PPO | Source: Ambulatory Visit | Attending: Obstetrics and Gynecology | Admitting: Obstetrics and Gynecology

## 2021-12-21 ENCOUNTER — Inpatient Hospital Stay (HOSPITAL_COMMUNITY)
Admission: AD | Admit: 2021-12-21 | Payer: BC Managed Care – PPO | Source: Home / Self Care | Admitting: Obstetrics and Gynecology

## 2021-12-21 DIAGNOSIS — O9081 Anemia of the puerperium: Secondary | ICD-10-CM | POA: Diagnosis not present

## 2021-12-21 DIAGNOSIS — O321XX0 Maternal care for breech presentation, not applicable or unspecified: Principal | ICD-10-CM

## 2021-12-21 DIAGNOSIS — Z23 Encounter for immunization: Secondary | ICD-10-CM

## 2021-12-21 DIAGNOSIS — O134 Gestational [pregnancy-induced] hypertension without significant proteinuria, complicating childbirth: Secondary | ICD-10-CM | POA: Diagnosis present

## 2021-12-21 DIAGNOSIS — Z3A37 37 weeks gestation of pregnancy: Secondary | ICD-10-CM

## 2021-12-21 DIAGNOSIS — Z98891 History of uterine scar from previous surgery: Principal | ICD-10-CM

## 2021-12-21 DIAGNOSIS — O169 Unspecified maternal hypertension, unspecified trimester: Secondary | ICD-10-CM

## 2021-12-21 DIAGNOSIS — D62 Acute posthemorrhagic anemia: Secondary | ICD-10-CM | POA: Diagnosis not present

## 2021-12-21 LAB — CBC
HCT: 38.5 % (ref 36.0–46.0)
HCT: 28.7 % — ABNORMAL LOW (ref 36.0–46.0)
Hemoglobin: 13.2 g/dL (ref 12.0–15.0)
Hemoglobin: 9.7 g/dL — ABNORMAL LOW (ref 12.0–15.0)
MCH: 32.4 pg (ref 26.0–34.0)
MCH: 31.9 pg (ref 26.0–34.0)
MCHC: 34.3 g/dL (ref 30.0–36.0)
MCHC: 33.8 g/dL (ref 30.0–36.0)
MCV: 94.4 fL (ref 80.0–100.0)
MCV: 94.4 fL (ref 80.0–100.0)
Platelets: 196 10*3/uL (ref 150–400)
Platelets: 135 10*3/uL — ABNORMAL LOW (ref 150–400)
RBC: 4.08 MIL/uL (ref 3.87–5.11)
RBC: 3.04 MIL/uL — ABNORMAL LOW (ref 3.87–5.11)
RDW: 12.2 % (ref 11.5–15.5)
RDW: 13.9 % (ref 11.5–15.5)
WBC: 10.1 10*3/uL (ref 4.0–10.5)
WBC: 10.2 10*3/uL (ref 4.0–10.5)
nRBC: 0 % (ref 0.0–0.2)
nRBC: 0 % (ref 0.0–0.2)

## 2021-12-21 LAB — DIC (DISSEMINATED INTRAVASCULAR COAGULATION)PANEL
D-Dimer, Quant: 3.83 ug/mL-FEU — ABNORMAL HIGH (ref 0.00–0.50)
Fibrinogen: 266 mg/dL (ref 210–475)
INR: 1.3 — ABNORMAL HIGH (ref 0.8–1.2)
Platelets: 134 10*3/uL — ABNORMAL LOW (ref 150–400)
Prothrombin Time: 16.1 seconds — ABNORMAL HIGH (ref 11.4–15.2)
Smear Review: NONE SEEN
aPTT: 27 seconds (ref 24–36)

## 2021-12-21 LAB — COMPREHENSIVE METABOLIC PANEL
ALT: 13 U/L (ref 0–44)
AST: 19 U/L (ref 15–41)
Albumin: 2.6 g/dL — ABNORMAL LOW (ref 3.5–5.0)
Alkaline Phosphatase: 147 U/L — ABNORMAL HIGH (ref 38–126)
Anion gap: 9 (ref 5–15)
BUN: 6 mg/dL (ref 6–20)
CO2: 19 mmol/L — ABNORMAL LOW (ref 22–32)
Calcium: 8.7 mg/dL — ABNORMAL LOW (ref 8.9–10.3)
Chloride: 110 mmol/L (ref 98–111)
Creatinine, Ser: 0.68 mg/dL (ref 0.44–1.00)
GFR, Estimated: >60 mL/min (ref >60)
Glucose, Bld: 86 mg/dL (ref 70–99)
Potassium: 4.1 mmol/L (ref 3.5–5.1)
Sodium: 138 mmol/L (ref 135–145)
Total Bilirubin: 0.6 mg/dL (ref 0.3–1.2)
Total Protein: 5.1 g/dL — ABNORMAL LOW (ref 6.5–8.1)

## 2021-12-21 LAB — ABO/RH: ABO/RH(D): A POS

## 2021-12-21 LAB — PREPARE RBC (CROSSMATCH)

## 2021-12-21 SURGERY — Surgical Case
Anesthesia: Epidural

## 2021-12-21 MED ORDER — FENTANYL CITRATE (PF) 100 MCG/2ML IJ SOLN
INTRAMUSCULAR | Status: DC | PRN
Start: 2021-12-21 — End: 2021-12-21
  Administered 2021-12-21: 100 ug via EPIDURAL

## 2021-12-21 MED ORDER — DIPHENHYDRAMINE HCL 50 MG/ML IJ SOLN
12.5000 mg | INTRAMUSCULAR | Status: DC | PRN
  Administered 2021-12-22: 12.5 mg via INTRAVENOUS
  Filled 2021-12-21 (×2): qty 1

## 2021-12-21 MED ORDER — IBUPROFEN 600 MG PO TABS
600.0000 mg | ORAL_TABLET | Freq: Four times a day (QID) | ORAL | Status: AC
  Administered 2021-12-21 – 2021-12-24 (×12): 600 mg via ORAL
  Filled 2021-12-21 (×12): qty 1

## 2021-12-21 MED ORDER — WITCH HAZEL-GLYCERIN EX PADS: 1.0000 "application " | MEDICATED_PAD | CUTANEOUS | Status: DC | PRN

## 2021-12-21 MED ORDER — ALBUMIN HUMAN 5 % IV SOLN: INTRAVENOUS | Status: DC | PRN

## 2021-12-21 MED ORDER — TERBUTALINE SULFATE 1 MG/ML IJ SOLN
INTRAMUSCULAR | Status: AC
  Filled 2021-12-21: qty 1

## 2021-12-21 MED ORDER — SODIUM CHLORIDE 0.9% FLUSH: 3.0000 mL | INTRAVENOUS | Status: DC | PRN

## 2021-12-21 MED ORDER — SODIUM CHLORIDE 0.9 % IR SOLN
Status: DC | PRN
  Administered 2021-12-21: 1000 mL

## 2021-12-21 MED ORDER — SIMETHICONE 80 MG PO CHEW: 80.0000 mg | CHEWABLE_TABLET | ORAL | Status: DC | PRN

## 2021-12-21 MED ORDER — BUTORPHANOL TARTRATE 1 MG/ML IJ SOLN: 1.0000 mg | INTRAMUSCULAR | Status: DC | PRN

## 2021-12-21 MED ORDER — OXYTOCIN-SODIUM CHLORIDE 30-0.9 UT/500ML-% IV SOLN: 2.5000 [IU]/h | INTRAVENOUS | Status: DC

## 2021-12-21 MED ORDER — PHENYLEPHRINE 80 MCG/ML (10ML) SYRINGE FOR IV PUSH (FOR BLOOD PRESSURE SUPPORT)
80.0000 ug | PREFILLED_SYRINGE | INTRAVENOUS | Status: DC | PRN
  Administered 2021-12-21: 400 ug via INTRAVENOUS

## 2021-12-21 MED ORDER — ZOLPIDEM TARTRATE 5 MG PO TABS: 5.0000 mg | ORAL_TABLET | Freq: Every evening | ORAL | Status: DC | PRN

## 2021-12-21 MED ORDER — LACTATED RINGERS IV SOLN
500.0000 mL | Freq: Once | INTRAVENOUS | Status: AC
  Administered 2021-12-21: 500 mL via INTRAVENOUS

## 2021-12-21 MED ORDER — DIPHENHYDRAMINE HCL 25 MG PO CAPS: 25.0000 mg | ORAL_CAPSULE | Freq: Four times a day (QID) | ORAL | Status: DC | PRN

## 2021-12-21 MED ORDER — MORPHINE SULFATE (PF) 0.5 MG/ML IJ SOLN
INTRAMUSCULAR | Status: DC | PRN
  Administered 2021-12-21: 3 mg via EPIDURAL

## 2021-12-21 MED ORDER — OXYTOCIN-SODIUM CHLORIDE 30-0.9 UT/500ML-% IV SOLN
INTRAVENOUS | Status: AC
  Filled 2021-12-21: qty 500

## 2021-12-21 MED ORDER — SCOPOLAMINE 1 MG/3DAYS TD PT72
1.0000 | MEDICATED_PATCH | Freq: Once | TRANSDERMAL | Status: AC
  Administered 2021-12-21: 1.5 mg via TRANSDERMAL

## 2021-12-21 MED ORDER — SOD CITRATE-CITRIC ACID 500-334 MG/5ML PO SOLN
30.0000 mL | ORAL | Status: DC | PRN
Start: 2021-12-21 — End: 2021-12-21

## 2021-12-21 MED ORDER — OXYTOCIN-SODIUM CHLORIDE 30-0.9 UT/500ML-% IV SOLN
2.5000 [IU]/h | INTRAVENOUS | Status: AC
  Filled 2021-12-21: qty 500

## 2021-12-21 MED ORDER — TRANEXAMIC ACID-NACL 1000-0.7 MG/100ML-% IV SOLN
INTRAVENOUS | Status: DC | PRN
  Administered 2021-12-21: 1000 mg via INTRAVENOUS

## 2021-12-21 MED ORDER — FENTANYL CITRATE (PF) 100 MCG/2ML IJ SOLN
INTRAMUSCULAR | Status: AC
  Filled 2021-12-21: qty 2

## 2021-12-21 MED ORDER — NALOXONE HCL 4 MG/10ML IJ SOLN: 1.0000 ug/kg/h | INTRAVENOUS | Status: DC | PRN

## 2021-12-21 MED ORDER — CEFAZOLIN SODIUM-DEXTROSE 2-3 GM-%(50ML) IV SOLR
INTRAVENOUS | Status: DC | PRN
  Administered 2021-12-21: 2 g via INTRAVENOUS

## 2021-12-21 MED ORDER — DIPHENHYDRAMINE HCL 50 MG/ML IJ SOLN: 12.5000 mg | INTRAMUSCULAR | Status: DC | PRN

## 2021-12-21 MED ORDER — FENTANYL CITRATE (PF) 100 MCG/2ML IJ SOLN
25.0000 ug | INTRAMUSCULAR | Status: DC | PRN
  Administered 2021-12-21: 50 ug via INTRAVENOUS

## 2021-12-21 MED ORDER — OXYCODONE-ACETAMINOPHEN 5-325 MG PO TABS: 1.0000 | ORAL_TABLET | ORAL | Status: DC | PRN

## 2021-12-21 MED ORDER — LACTATED RINGERS IV SOLN: INTRAVENOUS | Status: DC

## 2021-12-21 MED ORDER — EPINEPHRINE 1 MG/10ML IJ SOSY
PREFILLED_SYRINGE | INTRAMUSCULAR | Status: AC
  Filled 2021-12-21: qty 10

## 2021-12-21 MED ORDER — NALOXONE HCL 0.4 MG/ML IJ SOLN: 0.4000 mg | INTRAMUSCULAR | Status: DC | PRN

## 2021-12-21 MED ORDER — LACTATED RINGERS IV SOLN: INTRAVENOUS | Status: DC | PRN

## 2021-12-21 MED ORDER — SODIUM CHLORIDE 0.9% IV SOLUTION: Freq: Once | INTRAVENOUS | Status: DC

## 2021-12-21 MED ORDER — SIMETHICONE 80 MG PO CHEW
80.0000 mg | CHEWABLE_TABLET | Freq: Three times a day (TID) | ORAL | Status: DC
  Administered 2021-12-22 – 2021-12-24 (×6): 80 mg via ORAL
  Filled 2021-12-21 (×7): qty 1

## 2021-12-21 MED ORDER — EPHEDRINE 5 MG/ML INJ: 10.0000 mg | INTRAVENOUS | Status: DC | PRN

## 2021-12-21 MED ORDER — SODIUM CHLORIDE 0.9 % IV SOLN: INTRAVENOUS | Status: DC | PRN

## 2021-12-21 MED ORDER — PHENYLEPHRINE 80 MCG/ML (10ML) SYRINGE FOR IV PUSH (FOR BLOOD PRESSURE SUPPORT)
PREFILLED_SYRINGE | INTRAVENOUS | Status: AC
  Filled 2021-12-21: qty 10

## 2021-12-21 MED ORDER — ONDANSETRON HCL 4 MG/2ML IJ SOLN
INTRAMUSCULAR | Status: AC
  Filled 2021-12-21: qty 2

## 2021-12-21 MED ORDER — EPHEDRINE 5 MG/ML INJ
INTRAVENOUS | Status: AC
  Filled 2021-12-21: qty 5

## 2021-12-21 MED ORDER — LIDOCAINE-EPINEPHRINE (PF) 2 %-1:200000 IJ SOLN
INTRAMUSCULAR | Status: DC | PRN
  Administered 2021-12-21 (×2): 5 mL via EPIDURAL

## 2021-12-21 MED ORDER — ALBUMIN HUMAN 5 % IV SOLN
INTRAVENOUS | Status: AC
  Filled 2021-12-21: qty 250

## 2021-12-21 MED ORDER — EPHEDRINE SULFATE-NACL 50-0.9 MG/10ML-% IV SOSY
PREFILLED_SYRINGE | INTRAVENOUS | Status: DC | PRN
  Administered 2021-12-21: 15 mg via INTRAVENOUS

## 2021-12-21 MED ORDER — LIDOCAINE-EPINEPHRINE (PF) 2 %-1:200000 IJ SOLN
INTRAMUSCULAR | Status: DC | PRN
  Administered 2021-12-21: 5 mL via EPIDURAL

## 2021-12-21 MED ORDER — CEFAZOLIN SODIUM-DEXTROSE 2-4 GM/100ML-% IV SOLN
INTRAVENOUS | Status: AC
  Filled 2021-12-21: qty 100

## 2021-12-21 MED ORDER — DIBUCAINE (PERIANAL) 1 % EX OINT: 1.0000 "application " | TOPICAL_OINTMENT | CUTANEOUS | Status: DC | PRN

## 2021-12-21 MED ORDER — ACETAMINOPHEN 10 MG/ML IV SOLN
1000.0000 mg | Freq: Once | INTRAVENOUS | Status: DC | PRN
  Administered 2021-12-21: 1000 mg via INTRAVENOUS

## 2021-12-21 MED ORDER — FENTANYL-BUPIVACAINE-NACL 0.5-0.125-0.9 MG/250ML-% EP SOLN
12.0000 mL/h | EPIDURAL | Status: DC | PRN
  Filled 2021-12-21: qty 250

## 2021-12-21 MED ORDER — OXYCODONE-ACETAMINOPHEN 5-325 MG PO TABS: 2.0000 | ORAL_TABLET | ORAL | Status: DC | PRN

## 2021-12-21 MED ORDER — LIDOCAINE HCL (PF) 1 % IJ SOLN: 30.0000 mL | INTRAMUSCULAR | Status: DC | PRN

## 2021-12-21 MED ORDER — SENNOSIDES-DOCUSATE SODIUM 8.6-50 MG PO TABS
2.0000 | ORAL_TABLET | Freq: Every day | ORAL | Status: DC
  Administered 2021-12-22 – 2021-12-24 (×3): 2 via ORAL
  Filled 2021-12-21 (×3): qty 2

## 2021-12-21 MED ORDER — CEFAZOLIN SODIUM-DEXTROSE 2-4 GM/100ML-% IV SOLN
2.0000 g | Freq: Three times a day (TID) | INTRAVENOUS | Status: AC
  Administered 2021-12-21: 2 g via INTRAVENOUS
  Filled 2021-12-21 (×2): qty 100

## 2021-12-21 MED ORDER — FENTANYL CITRATE (PF) 100 MCG/2ML IJ SOLN
INTRAMUSCULAR | Status: DC | PRN
Start: 2021-12-21 — End: 2021-12-21
  Administered 2021-12-21: 50 ug via INTRAVENOUS

## 2021-12-21 MED ORDER — STERILE WATER FOR IRRIGATION IR SOLN
Status: DC | PRN
  Administered 2021-12-21: 1000 mL

## 2021-12-21 MED ORDER — KETOROLAC TROMETHAMINE 30 MG/ML IJ SOLN: 30.0000 mg | Freq: Four times a day (QID) | INTRAMUSCULAR | Status: AC | PRN

## 2021-12-21 MED ORDER — COCONUT OIL OIL
1.0000 "application " | TOPICAL_OIL | Status: DC | PRN
  Administered 2021-12-22: 1 via TOPICAL

## 2021-12-21 MED ORDER — ACETAMINOPHEN 325 MG PO TABS: 650.0000 mg | ORAL_TABLET | ORAL | Status: DC | PRN

## 2021-12-21 MED ORDER — OXYTOCIN BOLUS FROM INFUSION
333.0000 mL | Freq: Once | INTRAVENOUS | Status: AC
  Administered 2021-12-21: 333 mL via INTRAVENOUS

## 2021-12-21 MED ORDER — ACETAMINOPHEN 500 MG PO TABS
1000.0000 mg | ORAL_TABLET | Freq: Four times a day (QID) | ORAL | Status: AC
  Administered 2021-12-21 – 2021-12-22 (×4): 1000 mg via ORAL
  Filled 2021-12-21 (×4): qty 2

## 2021-12-21 MED ORDER — ONDANSETRON HCL 4 MG/2ML IJ SOLN
4.0000 mg | Freq: Four times a day (QID) | INTRAMUSCULAR | Status: DC | PRN
  Administered 2021-12-21: 4 mg via INTRAVENOUS

## 2021-12-21 MED ORDER — LACTATED RINGERS IV SOLN: 500.0000 mL | INTRAVENOUS | Status: DC | PRN

## 2021-12-21 MED ORDER — HYDROXYZINE HCL 50 MG PO TABS: 50.0000 mg | ORAL_TABLET | Freq: Four times a day (QID) | ORAL | Status: DC | PRN

## 2021-12-21 MED ORDER — OXYCODONE HCL 5 MG PO TABS
5.0000 mg | ORAL_TABLET | ORAL | Status: DC | PRN
  Administered 2021-12-22: 5 mg via ORAL
  Administered 2021-12-22: 10 mg via ORAL
  Administered 2021-12-23 – 2021-12-24 (×3): 5 mg via ORAL
  Filled 2021-12-21: qty 2
  Filled 2021-12-21 (×4): qty 1

## 2021-12-21 MED ORDER — ACETAMINOPHEN 325 MG PO TABS
650.0000 mg | ORAL_TABLET | ORAL | Status: DC | PRN
  Administered 2021-12-23 – 2021-12-24 (×4): 650 mg via ORAL
  Filled 2021-12-21 (×4): qty 2

## 2021-12-21 MED ORDER — PRENATAL MULTIVITAMIN CH
1.0000 | ORAL_TABLET | Freq: Every day | ORAL | Status: DC
  Administered 2021-12-22 – 2021-12-24 (×3): 1 via ORAL
  Filled 2021-12-21 (×3): qty 1

## 2021-12-21 MED ORDER — TETANUS-DIPHTH-ACELL PERTUSSIS 5-2.5-18.5 LF-MCG/0.5 IM SUSY
0.5000 mL | PREFILLED_SYRINGE | Freq: Once | INTRAMUSCULAR | Status: AC
  Administered 2021-12-22: 0.5 mL via INTRAMUSCULAR
  Filled 2021-12-21: qty 0.5

## 2021-12-21 MED ORDER — ESCITALOPRAM OXALATE 10 MG PO TABS
5.0000 mg | ORAL_TABLET | Freq: Every day | ORAL | Status: DC
Start: 2021-12-21 — End: 2021-12-23
  Administered 2021-12-22: 5 mg via ORAL
  Administered 2021-12-23: 10 mg via ORAL
  Filled 2021-12-21 (×2): qty 1

## 2021-12-21 MED ORDER — SCOPOLAMINE 1 MG/3DAYS TD PT72
MEDICATED_PATCH | TRANSDERMAL | Status: AC
  Filled 2021-12-21: qty 1

## 2021-12-21 MED ORDER — LIDOCAINE-EPINEPHRINE (PF) 2 %-1:200000 IJ SOLN
INTRAMUSCULAR | Status: AC
  Filled 2021-12-21: qty 20

## 2021-12-21 MED ORDER — FENTANYL CITRATE (PF) 100 MCG/2ML IJ SOLN
INTRAMUSCULAR | Status: AC
Start: 2021-12-21 — End: ?
  Filled 2021-12-21: qty 2

## 2021-12-21 MED ORDER — SOD CITRATE-CITRIC ACID 500-334 MG/5ML PO SOLN
ORAL | Status: AC
  Filled 2021-12-21: qty 30

## 2021-12-21 MED ORDER — DEXAMETHASONE SODIUM PHOSPHATE 4 MG/ML IJ SOLN
INTRAMUSCULAR | Status: AC
  Filled 2021-12-21: qty 1

## 2021-12-21 MED ORDER — MENTHOL 3 MG MT LOZG: 1.0000 | LOZENGE | OROMUCOSAL | Status: DC | PRN

## 2021-12-21 MED ORDER — ONDANSETRON HCL 4 MG/2ML IJ SOLN
4.0000 mg | Freq: Three times a day (TID) | INTRAMUSCULAR | Status: DC | PRN
  Filled 2021-12-21: qty 2

## 2021-12-21 MED ORDER — PHENYLEPHRINE HCL-NACL 20-0.9 MG/250ML-% IV SOLN
INTRAVENOUS | Status: DC | PRN
Start: 2021-12-21 — End: 2021-12-21
  Administered 2021-12-21: 60 ug/min via INTRAVENOUS

## 2021-12-21 MED ORDER — DIPHENHYDRAMINE HCL 25 MG PO CAPS: 25.0000 mg | ORAL_CAPSULE | ORAL | Status: DC | PRN

## 2021-12-21 MED ORDER — TRANEXAMIC ACID-NACL 1000-0.7 MG/100ML-% IV SOLN
INTRAVENOUS | Status: AC
  Filled 2021-12-21: qty 100

## 2021-12-21 MED ORDER — ACETAMINOPHEN 10 MG/ML IV SOLN
INTRAVENOUS | Status: AC
Start: 2021-12-21 — End: ?
  Filled 2021-12-21: qty 100

## 2021-12-21 MED ORDER — PHENYLEPHRINE 80 MCG/ML (10ML) SYRINGE FOR IV PUSH (FOR BLOOD PRESSURE SUPPORT)
80.0000 ug | PREFILLED_SYRINGE | INTRAVENOUS | Status: DC | PRN
  Filled 2021-12-21: qty 10

## 2021-12-21 MED ORDER — MORPHINE SULFATE (PF) 0.5 MG/ML IJ SOLN
INTRAMUSCULAR | Status: AC
  Filled 2021-12-21: qty 10

## 2021-12-21 MED ORDER — OXYTOCIN-SODIUM CHLORIDE 30-0.9 UT/500ML-% IV SOLN
INTRAVENOUS | Status: DC | PRN
  Administered 2021-12-21: 300 mL via INTRAVENOUS

## 2021-12-21 SURGICAL SUPPLY — 33 items
APL SKNCLS STERI-STRIP NONHPOA (GAUZE/BANDAGES/DRESSINGS) ×1
BENZOIN TINCTURE PRP APPL 2/3 (GAUZE/BANDAGES/DRESSINGS) ×1 IMPLANT
CHLORAPREP W/TINT 26ML (MISCELLANEOUS) ×4 IMPLANT
CLAMP CORD UMBIL (MISCELLANEOUS) ×2 IMPLANT
CLOSURE STERI STRIP 1/2 X4 (GAUZE/BANDAGES/DRESSINGS) ×1 IMPLANT
CLOTH BEACON ORANGE TIMEOUT ST (SAFETY) ×2 IMPLANT
DRSG OPSITE POSTOP 4X10 (GAUZE/BANDAGES/DRESSINGS) ×2 IMPLANT
ELECT REM PT RETURN 9FT ADLT (ELECTROSURGICAL) ×2
ELECTRODE REM PT RTRN 9FT ADLT (ELECTROSURGICAL) ×1 IMPLANT
EXTRACTOR VACUUM M CUP 4 TUBE (SUCTIONS) IMPLANT
GLOVE BIOGEL PI IND STRL 7.0 (GLOVE) ×2 IMPLANT
GLOVE BIOGEL PI IND STRL 7.5 (GLOVE) ×1 IMPLANT
GLOVE BIOGEL PI INDICATOR 7.0 (GLOVE) ×2
GLOVE BIOGEL PI INDICATOR 7.5 (GLOVE) ×1
GLOVE ECLIPSE 7.0 STRL STRAW (GLOVE) ×2 IMPLANT
GOWN STRL REUS W/TWL LRG LVL3 (GOWN DISPOSABLE) ×4 IMPLANT
KIT ABG SYR 3ML LUER SLIP (SYRINGE) IMPLANT
NDL HYPO 25X5/8 SAFETYGLIDE (NEEDLE) IMPLANT
NEEDLE HYPO 25X5/8 SAFETYGLIDE (NEEDLE) IMPLANT
NS IRRIG 1000ML POUR BTL (IV SOLUTION) ×2 IMPLANT
PACK C SECTION WH (CUSTOM PROCEDURE TRAY) ×2 IMPLANT
PAD ABD 8X10 STRL (GAUZE/BANDAGES/DRESSINGS) ×1 IMPLANT
PAD OB MATERNITY 4.3X12.25 (PERSONAL CARE ITEMS) ×2 IMPLANT
STRIP CLOSURE SKIN 1/2X4 (GAUZE/BANDAGES/DRESSINGS) IMPLANT
SUT PDS AB 0 CTX 60 (SUTURE) ×4 IMPLANT
SUT PLAIN 0 NONE (SUTURE) IMPLANT
SUT VIC AB 0 CT1 36 (SUTURE) IMPLANT
SUT VIC AB 0 CTX 36 (SUTURE) ×8
SUT VIC AB 0 CTX36XBRD ANBCTRL (SUTURE) ×4 IMPLANT
SUT VIC AB 4-0 KS 27 (SUTURE) ×2 IMPLANT
TOWEL OR 17X24 6PK STRL BLUE (TOWEL DISPOSABLE) ×2 IMPLANT
TRAY FOLEY W/BAG SLVR 14FR LF (SET/KITS/TRAYS/PACK) ×2 IMPLANT
WATER STERILE IRR 1000ML POUR (IV SOLUTION) ×2 IMPLANT

## 2021-12-21 NOTE — Op Note (Addendum)
Operative Note  PROCEDURE DATE: 12/21/21   PREOPERATIVE DIAGNOSIS: Malpresentation, gestational hypertension, AMA, [redacted]w[redacted]d IVF with donor egg   POSTOPERATIVE DIAGNOSIS: The same   PROCEDURE:    Primary Low Transverse Cesarean Section   SURGEON:  Dr. AAlpha GulaASSISTANT: Dr. TDarron DoomAn experienced assistant was required given the standard of surgical care given the complexity of the case.  This assistant was needed for exposure, dissection, suctioning, retraction, instrument exchange, assisting with delivery with administration of fundal pressure, and for overall help during the procedure.    INDICATIONS: This is a 46y.o. yo [id number] cesarean section secondary to malpresentation and failed external cephalic version.   Decision made to proceed with LTCS. The risks of cesarean section discussed with the patient included but were not limited to: bleeding which may require transfusion or reoperation; infection which may require antibiotics; injury to bowel, bladder, ureters or other surrounding organs; injury to the fetus; need for additional procedures including hysterectomy in the event of a life-threatening hemorrhage; placental abnormalities wth subsequent pregnancies, incisional problems, thromboembolic phenomenon and other postoperative/anesthesia complications. The patient agreed with the proposed plan, giving informed consent for the procedure.     FINDINGS:  Viable female infant in transverse, back down presentation, APGARs 2, 9, 9,  Weight pending, Amniotic fluid clear,  Intact placenta, three vessel cord.  Grossly normal uterus  .   ANESTHESIA:    Epidural ESTIMATED BLOOD LOSS: 2877 cc SPECIMENS: Placenta for routine COMPLICATIONS: None immediate    PROCEDURE IN DETAIL:  The patient received intravenous antibiotics (2g Ancef) and had sequential compression devices applied to her lower extremities while in the preoperative area.  She was then taken to the  operating room where epidural anesthesia was dosed up to surgical level and was found to be adequate. She was then placed in a dorsal supine position with a leftward tilt, and prepped and draped in a sterile manner.  A foley catheter was placed into her bladder and attached to constant gravity.  After an adequate timeout was performed, a Pfannenstiel skin incision was made with scalpel and carried through to the underlying layer of fascia. The fascia was incised in the midline and this incision was extended bilaterally using the Mayo scissors. Kocher clamps were applied to the superior aspect of the fascial incision and the underlying rectus muscles were dissected off bluntly. A similar process was carried out on the inferior aspect of the facial incision. The rectus muscles were separated in the midline bluntly and the peritoneum was entered bluntly.  A bladder flap was created sharply and developed bluntly. A transverse hysterotomy was made with a scalpel and extended bilaterally bluntly. At time of hysterotomy, large sinuses with brisk heavy bleeding was noted.  This was clamped with ring forcep but continued to bleed. Amniotomy was then made and baby was transverse, back down.  Due to brisk bleeding and difficulty with fetal position, faculty assistance from Dr PrKennon Roundsas requested for assistance who responded immediately. The infant was successfully delivered, and cord was clamped and cut and infant was handed over to awaiting neonatology team. Uterine massage was then administered and the placenta delivered intact with three-vessel cord. Multiple ring forceps were placed on the hysterotomy. At this point, blood loss was noted to be exceedingly high and transfusion was initiated.  2u pRBC and 2u FFP planned for transfusion. The uterus was cleared of clot and debris.  The hysterotomy was closed with 0-monocryl.  A second imbricating suture of  0-monocryl was used to reinforce the incision and aid in hemostasis.   Hemostasis throughout the field was achieved with cautery and additional arista was placed on the hysterotomy and rectus muscles. The fascia was closed with 0-PDS in a running fashion with good restoration of anatomy.  The subcutaneus tissue was irrigated and was reapproximated using running plain gut.  The skin was closed with 4-0 Vicryl in a subcuticular fashion.  All surgical site and was hemostatic at end of procedure without any further bleeding on exam.    Pt tolerated the procedure well. All sponge/lap/needle counts were correct  X 2. Pt taken to recovery room in stable condition.     Alpha Gula MD

## 2021-12-21 NOTE — Anesthesia Postprocedure Evaluation (Signed)
Anesthesia Post Note  Patient: Miranda [name]  Procedure(s) Performed: VERSION        Patient location during evaluation: PACU Anesthesia Type: Epidural Level of consciousness: awake and awake and alert Pain management: pain level controlled Vital Signs Assessment: post-procedure vital signs reviewed and stable Respiratory status: spontaneous breathing Cardiovascular status: blood pressure returned to baseline Postop Assessment: no headache, no backache and no apparent nausea or vomiting Anesthetic complications: no   No notable events documented.  Last Vitals: There were no vitals filed for this visit.  Last Pain: There were no vitals filed for this visit. Pain Goal:                   Emmalyne Giacomo L Shaday Rayborn

## 2021-12-21 NOTE — Procedures (Signed)
Procedure Note  Procedure: External Cephalic Version - unsuccessful  Diagnosis - breech presentation.  Patient presents for scheduled ECV for breech presentation.  She has a history of two prior vaginal deliveries.  Breech presentation was again noted today on bedside ultrasound. She was counseled on management options including PCS vs ECV attempt and then IOL for gHTN at 37 weeks. Risks of the procedure were reviewed including but not limited to fetal distress and failed attempt.  Epidural was placed for analgesia and terbutaline 0.25 mg [place] was given for uterine relaxation.  With intermittent fetal heart rate monitoring no more than 2 minute intervals, a forward roll was attempted twice, followed by backwards roll.  Palpation and US guidance confirmed minimal movement.  At this time, no further attempts were made.  Patient offered primary C section. Patient was counseled on the risks of Cesarean delivery, which include but are not limited to bleeding, infection, damage to nearby organs including bowel/bladder/ureter, need for additional procedure or blood transfusion, and implications for future pregnancy.  Patient agreeable to procedure, all questions answered.    OR aware, will proceed as able.  FHT reassuring.   Alpha Gula

## 2021-12-21 NOTE — Anesthesia Preprocedure Evaluation (Signed)
Anesthesia Evaluation  Patient identified by MRN, date of birth, ID band Patient awake    Reviewed: Allergy & Precautions, NPO status , Patient's Chart, lab work & pertinent test results  History of Anesthesia Complications (+) PONV and history of anesthetic complications  Airway Mallampati: II  TM Distance: >3 FB Neck ROM: Full    Dental no notable dental hx.    Pulmonary neg pulmonary ROS,    Pulmonary exam normal breath sounds clear to auscultation       Cardiovascular hypertension (gHTN), Normal cardiovascular exam Rhythm:Regular Rate:Normal     Neuro/Psych  Headaches, PSYCHIATRIC DISORDERS Anxiety Depression    GI/Hepatic negative GI ROS, Neg liver ROS,   Endo/Other  negative endocrine ROS  Renal/GU negative Renal ROS  negative genitourinary   Musculoskeletal negative musculoskeletal ROS (+)   Abdominal   Peds  Hematology negative hematology ROS (+)   Anesthesia Other Findings Presents for primary C/S after unsuccessful ECV. Epidural in place from version. Worked well for version.    Reproductive/Obstetrics (+) Pregnancy                             Anesthesia Physical Anesthesia Plan  ASA: 3  Anesthesia Plan: Epidural   Post-op Pain Management:    Induction:   PONV Risk Score and Plan: Treatment may vary due to age or medical condition, Scopolamine patch - Pre-op and Ondansetron  Airway Management Planned: Natural Airway  Additional Equipment:   Intra-op Plan:   Post-operative Plan:   Informed Consent: I have reviewed the patients History and Physical, chart, labs and discussed the procedure including the risks, benefits and alternatives for the proposed anesthesia with the patient or authorized representative who has indicated his/her understanding and acceptance.       Plan Discussed with: Anesthesiologist  Anesthesia Plan Comments: (Patient identified. Risks,  benefits, options discussed with patient including but not limited to bleeding, infection, nerve damage, paralysis, failed block, incomplete pain control, headache, blood pressure changes, nausea, vomiting, reactions to medication, itching, and post partum back pain. Confirmed with bedside nurse the patient's most recent platelet count. Confirmed with the patient that they are not taking any anticoagulation, have any bleeding history or any family history of bleeding disorders. Patient expressed understanding and wishes to proceed. All questions were answered. )        Anesthesia Quick Evaluation

## 2021-12-21 NOTE — Transfer of Care (Signed)
Immediate Anesthesia Transfer of Care Note  Patient: Miranda [name]  Procedure(s) Performed: CESAREAN SECTION  Patient Location: PACU  Anesthesia Type:Epidural  Level of Consciousness: awake  Airway & Oxygen Therapy: Patient Spontanous Breathing  Post-op Assessment: Report given to RN and Post -op Vital signs reviewed and stable  Post vital signs: Reviewed and stable  Last Vitals:  Vitals Value Taken Time  BP 101/59 12/21/21 1205  Temp 36 C 12/21/21 1205  Pulse 89 12/21/21 1205  Resp 18 12/21/21 1205  SpO2      Last Pain:  Vitals:   12/21/21 1205  TempSrc: Axillary  PainSc:          Complications: No notable events documented.

## 2021-12-21 NOTE — Lactation Note (Addendum)
This note was copied from a baby's chart. Lactation Consultation Note  Patient Name: Miranda [name] Strength Today's Date: 12/21/2021 Reason for consult: Initial assessment;Early term 37-38.6wks (See mom's MR: Meds are compatible with breastfeeding, C/S delivery.) Age:47 hours Mom has hx of breastfeeding see Maternal Data below. Per mom, she has [place] DEBP at home. Per mom, she feels breastfeeding is going well infant has been breastfeeding  20 to 30 minutes most feedings. LC did not observe latch, infant recently breastfeed at 2000 pm for 40 minutes and RN assisted with latch.  Mom will continue to breastfeed infant on demand according to hunger cues, 8 to 12 times within 24 hours, skin to skin. Mom will continue to latch infant on both breast during a feeding. Mom knows to call RN/ LC if mom has BF questions, concerns or need latch assistance. Mom made aware of O/P services, breastfeeding support groups, community resources, and our phone # for post-discharge questions.   Maternal Data Has patient been taught Hand Expression?: Yes Does the patient have breastfeeding experience prior to this delivery?: Yes How long did the patient breastfeed?: Per mom, she BF 1st child for 6 months and 2nd child for 1 year, 2nd child is currenly 63 years old.  Feeding Mother's Current Feeding Choice: Breast Milk  LATCH Score                    Lactation Tools Discussed/Used    Interventions    Discharge Pump: Personal (Per mom, she has [place] DEBP at home.)  Consult Status Consult Status: Follow-up Date: 12/22/21 Follow-up type: In-patient    Vicente Serene 12/21/2021, 9:35 PM

## 2021-12-21 NOTE — Anesthesia Postprocedure Evaluation (Signed)
Anesthesia Post Note  Patient: Miranda [name]  Procedure(s) Performed: [place]     Patient location during evaluation: PACU Anesthesia Type: Epidural Level of consciousness: oriented and awake and alert Pain management: pain level controlled Vital Signs Assessment: post-procedure vital signs reviewed and stable Respiratory status: spontaneous breathing, respiratory function stable and patient connected to nasal cannula oxygen Cardiovascular status: blood pressure returned to baseline and stable Postop Assessment: no headache, no backache, no apparent nausea or vomiting and epidural receding Anesthetic complications: no   No notable events documented.  Last Vitals:  Vitals:   12/21/21 1607 12/21/21 1716  BP: 117/62 (!) 98/51  Pulse: 72 70  Resp: 17 20  Temp: 37.1 C 36.4 C  SpO2: 99% 95%    Last Pain:  Vitals:   12/21/21 1716  TempSrc: Oral  PainSc:    Pain Goal:                   Derwood Becraft L Johnnae Impastato

## 2021-12-21 NOTE — Anesthesia Procedure Notes (Signed)
Epidural Patient location during procedure: OB Start time: 12/21/2021 9:05 AM End time: 12/21/2021 9:15 AM  Staffing Anesthesiologist: Freddrick March, MD Performed: anesthesiologist   Preanesthetic Checklist Completed: patient identified, IV checked, risks and benefits discussed, monitors and equipment checked, pre-op evaluation and timeout performed  Epidural Patient position: sitting Prep: DuraPrep and site prepped and draped Patient monitoring: continuous pulse ox, blood pressure, heart rate and cardiac monitor Approach: midline Location: L3-L4 Injection technique: LOR air  Needle:  Needle type: Tuohy  Needle gauge: 17 G Needle length: 9 cm Needle insertion depth: 5 cm Catheter type: closed end flexible Catheter size: 19 Gauge Catheter at skin depth: 10 cm Test dose: negative  Assessment Sensory level: T8 Events: blood not aspirated, injection not painful, no injection resistance, no paresthesia and negative IV test  Additional Notes Patient identified. Risks/Benefits/Options discussed with patient including but not limited to bleeding, infection, nerve damage, paralysis, failed block, incomplete pain control, headache, blood pressure changes, nausea, vomiting, reactions to medication both or allergic, itching and postpartum back pain. Confirmed with bedside nurse the patient's most recent platelet count. Confirmed with patient that they are not currently taking any anticoagulation, have any bleeding history or any family history of bleeding disorders. Patient expressed understanding and wished to proceed. All questions were answered. Sterile technique was used throughout the entire procedure. Please see nursing notes for vital signs. Test dose was given through epidural catheter and negative prior to continuing to dose epidural or start infusion. Warning signs of high block given to the patient including shortness of breath, tingling/numbness in hands, complete motor block,  or any concerning symptoms with instructions to call for help. Patient was given instructions on fall risk and not to get out of bed. All questions and concerns addressed with instructions to call with any issues or inadequate analgesia.  Reason for block:procedure for pain

## 2021-12-21 NOTE — Progress Notes (Signed)
At bedside to check on patient.  She is continuing to feel more improved.  BP 117/64   Pulse 79   Temp (!) 96.8 F (36 C) (Axillary)   Resp (!) 24   Ht '5\' 7"'$  (1.702 m)   Wt 97 kg   LMP 02/20/2021   SpO2 96%   Breastfeeding Unknown   BMI 33.50 kg/m   Labs in PACU collected following transfusion. HGB 9.7 Fibrinogen 266 PLT 135  Vitals and labs reassuring.  Discussed likelihood of equilibration on repeat labs in AM.  All questions answered.  Alpha Gula

## 2021-12-21 NOTE — H&P (Signed)
OB History and Physical   Miranda [name] is a 46 y.o. female [phone] presenting for scheduled for external cephalic version at [redacted]w[redacted]d  Pregnancy course is notable for AMA, gestational hypertension, IVF with donor egg, and new onset mild range blood pressures.  She has had elevated BP in the office and MAU.  Baby was transverse head maternal right on 5/18 and then breech on 5/22.  Rh positive, GBS negative, panorama low risk.    OB History     Gravida  5   Para  2   Term  1   Preterm  1   AB  2   Living  2      SAB  2   IAB      Ectopic      Multiple      Live Births  2          Past Medical History:  Diagnosis Date   Anxiety    Depression    History of postpartum hemorrhage    Migraines    PONV (postoperative nausea and vomiting)    Pregnancy induced hypertension    Past Surgical History:  Procedure Laterality Date   BREAST BIOPSY     BREAST LUMPECTOMY WITH NEEDLE LOCALIZATION Right 12/07/2017   Procedure: BREAST LUMPECTOMY WITH NEEDLE LOCALIZATION AND RADIOACTIVE SEED;  Surgeon: WRolm Bookbinder MD;  [facility]  Service: General;  Laterality: Right;   DILATION AND EVACUATION N/A 09/11/2016   Procedure: DILATATION AND EVACUATION;  Surgeon: RMolli Posey MD;  [facility];  Service: Gynecology;  Laterality: N/A;   SHOULDER ARTHROSCOPY Right    SINUS SURGERY WITH INSTATRAK     Family History: family history includes BRCA 1/2 in her mother; Breast cancer in her mother; Parkinson's disease in her father. Social History:  reports that she has never smoked. She has never used smokeless tobacco. She reports current alcohol use. She reports that she does not use drugs.     Maternal Diabetes: No Genetic Screening: Normal Maternal Ultrasounds/Referrals: Normal Fetal Ultrasounds or other Referrals:  None Maternal Substance Abuse:  No Significant Maternal Medications:  None Significant Maternal Lab Results:  Group B Strep  negative Other Comments:  None  Review of Systems - Patient denies fever, chills, SOB, CP, N/V/D.  History   Blood pressure (!) 139/93, pulse 88, temperature 98.3 F (36.8 C), temperature source Oral, height '5\' 7"'  (1.702 m), weight 97 kg, last menstrual period 02/20/2021, SpO2 99 %, unknown if currently breastfeeding. Exam Physical Exam   Gen: alert, well appearing, no distress Chest: nonlabored breathing CV: no peripheral edema Abdomen: soft, gravid  Ext: no evidence of DVT  Prenatal labs: ABO, Rh: A/Positive/-- (11/17 0000) Antibody: Negative (11/17 0000) Rubella: Immune (11/17 0000) RPR: Nonreactive (11/17 0000)  HBsAg: Negative (11/17 0000)  HIV: Non-reactive (11/17 0000)  GBS:     Assessment/Plan: Admit to Labor and Delivery Breech confirmed on bedside ultrasound.  Discussed plan again for attempt at external cephalic version.  Discussed chances of success, as well as risks including but not limited to fetal distress, failure, need for urgent C section. Discussed use of terbutaline for uterine relaxation Discussed pain control during ECV and patient would like epidural. Patient agrees with plan, will attempt ECV.  If successful, plan for IOL today given gHTN and term status.  If unsuccessful will plan for primary C section for malposition. All questions answered.   TCarlyon Shadow5/28/2023, 8:01 AM

## 2021-12-21 NOTE — Anesthesia Preprocedure Evaluation (Addendum)
Anesthesia Evaluation  Patient identified by MRN, date of birth, ID band Patient awake    Reviewed: Allergy & Precautions, NPO status , Patient's Chart, lab work & pertinent test results  History of Anesthesia Complications (+) PONV and history of anesthetic complications  Airway Mallampati: II  TM Distance: >3 FB Neck ROM: Full    Dental no notable dental hx.    Pulmonary neg pulmonary ROS,    Pulmonary exam normal breath sounds clear to auscultation       Cardiovascular hypertension (gHTN), Normal cardiovascular exam Rhythm:Regular Rate:Normal     Neuro/Psych  Headaches, PSYCHIATRIC DISORDERS Anxiety Depression    GI/Hepatic negative GI ROS, Neg liver ROS,   Endo/Other  negative endocrine ROS  Renal/GU negative Renal ROS  negative genitourinary   Musculoskeletal negative musculoskeletal ROS (+)   Abdominal   Peds  Hematology negative hematology ROS (+)   Anesthesia Other Findings Presents for ECV  Reproductive/Obstetrics (+) Pregnancy                             Anesthesia Physical Anesthesia Plan  ASA: 3  Anesthesia Plan: Epidural   Post-op Pain Management:    Induction:   PONV Risk Score and Plan: Treatment may vary due to age or medical condition  Airway Management Planned: Natural Airway  Additional Equipment:   Intra-op Plan:   Post-operative Plan:   Informed Consent: I have reviewed the patients History and Physical, chart, labs and discussed the procedure including the risks, benefits and alternatives for the proposed anesthesia with the patient or authorized representative who has indicated his/her understanding and acceptance.       Plan Discussed with: Anesthesiologist  Anesthesia Plan Comments: (Patient identified. Risks, benefits, options discussed with patient including but not limited to bleeding, infection, nerve damage, paralysis, failed block,  incomplete pain control, headache, blood pressure changes, nausea, vomiting, reactions to medication, itching, and post partum back pain. Confirmed with bedside nurse the patient's most recent platelet count. Confirmed with the patient that they are not taking any anticoagulation, have any bleeding history or any family history of bleeding disorders. Patient expressed understanding and wishes to proceed. All questions were answered. )        Anesthesia Quick Evaluation

## 2021-12-22 LAB — FIBRINOGEN: Fibrinogen: 258 mg/dL (ref 210–475)

## 2021-12-22 LAB — BPAM RBC
Blood Product Expiration Date: 202305312359
Blood Product Expiration Date: 202306022359
ISSUE DATE / TIME: 202305281117
ISSUE DATE / TIME: 202305281117
Unit Type and Rh: 9500
Unit Type and Rh: 9500

## 2021-12-22 LAB — CBC
HCT: 22.5 % — ABNORMAL LOW (ref 36.0–46.0)
Hemoglobin: 7.4 g/dL — ABNORMAL LOW (ref 12.0–15.0)
MCH: 31.1 pg (ref 26.0–34.0)
MCHC: 32.9 g/dL (ref 30.0–36.0)
MCV: 94.5 fL (ref 80.0–100.0)
Platelets: 115 10*3/uL — ABNORMAL LOW (ref 150–400)
RBC: 2.38 MIL/uL — ABNORMAL LOW (ref 3.87–5.11)
RDW: 14.8 % (ref 11.5–15.5)
WBC: 8.9 10*3/uL (ref 4.0–10.5)
nRBC: 0 % (ref 0.0–0.2)

## 2021-12-22 LAB — TYPE AND SCREEN
ABO/RH(D): A POS
Antibody Screen: NEGATIVE
Unit division: 0
Unit division: 0

## 2021-12-22 LAB — RPR: RPR Ser Ql: NONREACTIVE

## 2021-12-22 LAB — BLOOD PRODUCT ORDER (VERBAL) VERIFICATION

## 2021-12-22 MED ORDER — FERROUS SULFATE 325 (65 FE) MG PO TABS
325.0000 mg | ORAL_TABLET | ORAL | Status: DC
Start: 2021-12-22 — End: 2021-12-24
  Administered 2021-12-22 – 2021-12-24 (×2): 325 mg via ORAL
  Filled 2021-12-22 (×2): qty 1

## 2021-12-22 MED ORDER — DOCUSATE SODIUM 100 MG PO CAPS
100.0000 mg | ORAL_CAPSULE | Freq: Every day | ORAL | Status: DC
Start: 2021-12-22 — End: 2021-12-24
  Administered 2021-12-22: 100 mg via ORAL
  Filled 2021-12-22 (×3): qty 1

## 2021-12-22 NOTE — Lactation Note (Addendum)
This note was copied from a baby's chart. Lactation Consultation Note  Patient Name: Miranda [name] Date: 12/22/2021 Reason for consult: Follow-up assessment;Early term 37-38.6wks;Maternal endocrine disorder Age:47 hours  Mom reports that infant latches, takes a few sucks, and then falls asleep. In light of likely ineffective breast stimulation & infant being [redacted] weeks gestation, I started the LPI policy on infant. Mom was shown volume parameters.  Extra-slow flow nipples are to be used for feedings. I informed Mom that supplementing could be skipped only if infant did very well at the breast.   Mom had a biopsy on the R breast in 2019. There is a peri-areolar scar from 12 o'clock to almost 5 o'clock on that breast. Mom reports that the biopsy went "deep" and resulted in a lot of scar tissue that required massage to break up. However, Mom is expressing easily from the R breast. Mom reports having leaked since [redacted] weeks gestation & having a hx of an abundant milk supply.   Mom needs a size 30 flange on the L breast (Mom reports a small amount of increased comfort with the size 30 on the L breast after initially trying a size 27 flange) & a size 27 flange on the R breast. An additional size 30 flange was left in the room in case she ever needs it.   Zenaida Deed, RN was given an update. Maternal Data Does the patient have breastfeeding experience prior to this delivery?: Yes   Lactation Tools Discussed/Used Tools: Pump;Flanges Flange Size: 30;27 Breast pump type: Double-Electric Breast Pump Reason for Pumping: ineffective breasts stimulation by baby Pumped volume: 4 mL  Interventions Interventions: DEBP;Education  Discharge Pump: Hands Free (Mom has a Willow pump)  Matthias Hughs [place] 12/22/2021, 12:40 PM

## 2021-12-22 NOTE — Progress Notes (Signed)
Postpartum Progress Note  Postpartum Day 1 s/p primary Cesarean section.  Subjective:  Patient reports no overnight events.  She reports well controlled pain, ambulating with assistance, foley in place, tolerating PO.  She reports Negative flatus, Negative BM.  Vaginal bleeding is appropriate.  She denies fever, chills, SOB, CP, palpitations, lightheadedness, dizziness.   Objective: Blood pressure (!) 95/53, pulse 67, temperature 98.3 F (36.8 C), temperature source Axillary, resp. rate 19, height '5\' 7"'$  (1.702 m), weight 97 kg, last menstrual period 02/20/2021, SpO2 97 %, unknown if currently breastfeeding.  Physical Exam:  General: alert and no distress Lochia: appropriate Uterine Fundus: firm Abdomen: soft, nondistended, ATTP Incision: dressing in place DVT Evaluation: No evidence of DVT seen on physical exam.  Recent Labs    12/21/21 1358 12/22/21 0512  HGB 9.7* 7.4*  HCT 28.7* 22.5*    Assessment/Plan: Postpartum Day 2, s/p C-section Acute blood loss anemia - Suspect equilibration.  Fibrinogen 258. Fe/Colace. No signs or symptoms of anemia.  UO borderline but adequate. Will monitor. Baby girl doing well Lactation following Doing well, continue routine postpartum care.   LOS: 1 day   Carlyon Shadow 12/22/2021, 7:56 AM

## 2021-12-22 NOTE — Lactation Note (Signed)
This note was copied from a baby's chart. Lactation Consultation Note  Patient Name: Miranda [name] Date: 12/22/2021 Reason for consult: Follow-up assessment Age:47 hours  I returned to Mom's room to share my observation that her L nipple may be "elastic" (ie the nipple diameter would increase to fill the tunnel of various flange sizes). In that case, I told Mom to use the flange that seems most comfortable to her.  Parents have used a pacifier; I recommended they supplement if infant is acting as if she needs a pacifier.  RN to provide coconut oil to use in lieu of maternal nipple balm that Mom had brought with her.   Maternal Data Does the patient have breastfeeding experience prior to this delivery?: Yes  Feeding Mother's Current Feeding Choice: Breast Milk and Formula Nipple Type: Extra Slow Flow  Interventions Interventions: Education  Discharge Pump: Hands Free (Mom has a Willow pump) WIC Program: No  Consult Status Consult Status: Follow-up Date: 12/23/21 Follow-up type: In-patient    Matthias Hughs [facility] 12/22/2021, 3:24 PM

## 2021-12-22 NOTE — Progress Notes (Signed)
MOB was referred for history of depression/anxiety. * Referral screened out by Clinical Social Worker because none of the following criteria appear to apply: ~ History of anxiety/depression during this pregnancy, or of post-partum depression following prior delivery. ~ Diagnosis of anxiety and/or depression within last 3 years OR * MOB's symptoms currently being treated with medication and/or therapy. MOB has a current Rx for Lexapro.   Please contact the Clinical Social Worker if needs arise, by [facility] request, or if MOB scores greater than 9/yes to question 10 on Edinburgh Postpartum Depression Screen.   Laurey Arrow, MSW, LCSW Clinical Social Work [phone]

## 2021-12-23 LAB — PREPARE FRESH FROZEN PLASMA: Unit division: 0

## 2021-12-23 LAB — BPAM FFP
Blood Product Expiration Date: 202305312359
Blood Product Expiration Date: 202305312359
ISSUE DATE / TIME: 202305281120
ISSUE DATE / TIME: 202305281120
Unit Type and Rh: 6200
Unit Type and Rh: 6200

## 2021-12-23 MED ORDER — ESCITALOPRAM OXALATE 10 MG PO TABS
10.0000 mg | ORAL_TABLET | Freq: Every day | ORAL | Status: DC
Start: 2021-12-24 — End: 2021-12-24
  Administered 2021-12-24: 10 mg via ORAL
  Filled 2021-12-23: qty 1

## 2021-12-23 NOTE — Progress Notes (Signed)
Subjective: Postpartum Day  2: Cesarean Delivery Patient reports incisional pain, tolerating PO, and no problems voiding.    Objective: Vital signs in last 24 hours: Temp:  [98.1 F (36.7 C)-98.4 F (36.9 C)] 98.3 F (36.8 C) (05/30 0750) Pulse Rate:  [65-76] 76 (05/30 0750) Resp:  [16-18] 16 (05/30 0750) BP: (104-128)/(53-68) 121/64 (05/30 0750) SpO2:  [88 %-98 %] 97 % (05/30 0750)  Physical Exam:  General: alert, cooperative, appears stated age, and no distress Lochia: appropriate Uterine Fundus: firm Incision: healing well DVT Evaluation: No evidence of DVT seen on physical exam.  Recent Labs    12/21/21 1358 12/22/21 0512  HGB 9.7* 7.4*  HCT 28.7* 22.5*    Assessment/Plan: Status post Cesarean section. Postoperative course complicated by acute blood loss anemia at time of surgery, s/p 2uPRBCs and FFP   Clinically stable and minimal blood loss Continue current care.  Luz Lex 12/23/2021, 9:26 AM

## 2021-12-23 NOTE — Lactation Note (Signed)
This note was copied from a baby's chart. Lactation Consultation Note  Patient Name: Girl Lillianna Sabel Today's Date: 12/23/2021   Age:47 hours  P2, Early Term, Infant Female  LC attempted to visit with the family, but mom is in the bathroom. LC will follow-up later.   Maternal Data    Feeding    LATCH Score                    Lactation Tools Discussed/Used    Interventions    Discharge    Consult Status      Lysbeth Penner 12/23/2021, 11:36 AM

## 2021-12-23 NOTE — Addendum Note (Signed)
Addendum  created 12/23/21 1350 by Freddrick March, MD   Intraprocedure Event edited

## 2021-12-23 NOTE — Lactation Note (Signed)
This note was copied from a baby's chart. Lactation Consultation Note  Patient Name: Miranda [name] [id number] Date: 12/23/2021 Reason for consult: Follow-up assessment;Early term 37-38.6wks;Breastfeeding assistance (6.07% WL) Age:47 hours  P3, Early Term, Infant Female 6%WL  LC entered the room and baby was STS with dad. Per mom breastfeeding is going well. Mom states that she is breastfeeding baby, supplementing, and pumping.   Mom states that baby stays on the breast for 10-15 min per breast and begins to get frustrated or fall asleep. Mom states that she has been consistent with pumping, but is not seeing an increase in volume yet. [place] praised mom for her efforts and encouraged her to continue to be consistent with her pumping efforts.   Mom asked if her milk volume would increase tomorrow. LC informed mom that there is no way to gauge when her milk volume will increase, but consistent stimulation is the best way to help her brain to get the signal to make more milk.   Mom denies any pain with latching or pumping. She states that she has no further questions or concerns.   Mom will call for latch assistance or with questions if needed.    Maternal Data    Feeding    LATCH Score                    Lactation Tools Discussed/Used    Interventions Interventions: Breast feeding basics reviewed;Education  Discharge    Consult Status Consult Status: Follow-up Date: 12/24/21 Follow-up type: In-patient    Miranda [name] 12/23/2021, 11:50 AM

## 2021-12-24 MED ORDER — OXYCODONE HCL 5 MG PO TABS: 5.0000 mg | ORAL_TABLET | ORAL | 0 refills | Status: DC | PRN

## 2021-12-24 MED ORDER — IBUPROFEN 600 MG PO TABS: 600.0000 mg | ORAL_TABLET | Freq: Four times a day (QID) | ORAL | 0 refills | Status: AC | PRN

## 2021-12-24 NOTE — Discharge Summary (Signed)
Postpartum Discharge Summary  Date of Service updated 12/24/21     Patient Name: Miranda [name] DOB: Nov 14, 1975 MRN: [id number]  Date of admission: 12/21/2021 Delivery date:12/21/2021  Delivering provider: Eyvonne Mechanic A  Date of discharge: 12/24/2021  Admitting diagnosis: Hypertension in pregnancy [O16.9] Intrauterine pregnancy: [redacted]w[redacted]d    Secondary diagnosis:  Principal Problem:   Hypertension in pregnancy Active Problems:   Breech presentation, no version  Additional problems:  AMA, IVF donor egg   Discharge diagnosis: Term Pregnancy Delivered, Gestational Hypertension, Anemia, and PPH                                              Post partum procedures:blood transfusion Augmentation: N/A Complications: [id number] Hospital course: Sceduled C/S   46y.o. yo [id number] admitted to the hospital 12/21/2021 for scheduled cesarean section with the following indication:Malpresentation and Gestational HTN .Delivery details are as follows:  Membrane Rupture Time/Date: 11:02 AM ,12/21/2021   Delivery Method:C-Section, Low Transverse  Details of operation can be found in separate operative note.  Patient had an uncomplicated postpartum course.  She is ambulating, tolerating a regular diet, passing flatus, and urinating well. Patient is discharged home in stable condition on  12/24/21        Newborn Data: Birth date:12/21/2021  Birth time:11:06 AM  Gender:Female  Living status:Living  Apgars:2 ,9  Weight:3030 g     Magnesium Sulfate received: No BMZ received: No Rhophylac:N/A MMR:N/A T-DaP:Given prenatally Flu: N/A Transfusion:Yes  Physical exam  Vitals:   12/23/21 1158 12/23/21 1553 12/23/21 1948 12/24/21 0506  BP: 121/71 122/68 129/68 123/80  Pulse: 78 84 81 70  Resp: _0 Temp: 98.9 F (37.2 C) 98.1 F (36.7 C) 98.4 F (36.9 C) 98.3 F (36.8 C)  TempSrc: Oral Oral Oral Oral  SpO2: 98% 100% 100% 100%  Weight:      Height:        General: alert and cooperative Lochia: appropriate Uterine Fundus: firm Incision: Healing well with no significant drainage DVT Evaluation: No evidence of DVT seen on physical exam. Labs: Lab Results  Component Value Date   WBC 8.9 12/22/2021   HGB 7.4 (L) 12/22/2021   HCT 22.5 (L) 12/22/2021   MCV 94.5 12/22/2021   PLT 115 (L) 12/22/2021      Latest Ref Rng & Units 12/21/2021    7:41 AM  CMP  Glucose 70 - 99 mg/dL 86    BUN 6 - 20 mg/dL 6    Creatinine 0.44 - 1.00 mg/dL 0.68    Sodium 135 - 145 mmol/L 138    Potassium 3.5 - 5.1 mmol/L 4.1    Chloride 98 - 111 mmol/L 110    CO2 22 - 32 mmol/L 19    Calcium 8.9 - 10.3 mg/dL 8.7    Total Protein 6.5 - 8.1 g/dL 5.1    Total Bilirubin 0.3 - 1.2 mg/dL 0.6    Alkaline Phos 38 - 126 U/L 147    AST 15 - 41 U/L 19    ALT 0 - 44 U/L 13     Edinburgh Score:    12/22/2021    1:00 PM  Edinburgh Postnatal Depression Scale Screening Tool  I have been able to laugh and see the funny side of things. 0  I have looked forward with enjoyment  to things. 0  I have blamed myself unnecessarily when things went wrong. 1  I have been anxious or worried for no good reason. 1  I have felt scared or panicky for no good reason. 0  Things have been getting on top of me. 0  I have been so unhappy that I have had difficulty sleeping. 0  I have felt sad or miserable. 0  I have been so unhappy that I have been crying. 0  The thought of harming myself has occurred to me. 0  Edinburgh Postnatal Depression Scale Total 2      After visit meds:  Allergies as of 12/24/2021   No Known Allergies      Medication List     STOP taking these medications    butalbital-acetaminophen-caffeine 50-325-40 MG tablet Commonly known as: FIORICET   ondansetron 4 MG disintegrating tablet Commonly known as: ZOFRAN-ODT       TAKE these medications    acetaminophen 500 MG tablet Commonly known as: TYLENOL Take 1,000 mg by mouth every 6 (six) hours as  needed for mild pain or headache.   docusate sodium 100 MG capsule Commonly known as: COLACE Take 100 mg by mouth daily as needed for mild constipation.   escitalopram 10 MG tablet Commonly known as: LEXAPRO Take 10 mg by mouth daily.   ibuprofen 600 MG tablet Commonly known as: ADVIL Take 1 tablet (600 mg total) by mouth every 6 (six) hours as needed.   oxyCODONE 5 MG immediate release tablet Commonly known as: Oxy IR/ROXICODONE Take 1 tablet (5 mg total) by mouth every 4 (four) hours as needed for severe pain.   prenatal multivitamin Tabs tablet Take 1 tablet by mouth daily at 12 noon.         Discharge home in stable condition Infant Feeding: Bottle and Breast Infant Disposition:home with mother Discharge instruction: per After Visit Summary and Postpartum booklet. Activity: Advance as tolerated. Pelvic rest for 6 weeks.  Diet: routine diet Anticipated Birth Control: Unsure Postpartum Appointment:2-3 days Additional Postpartum F/U: BP check 2-3 days Future Appointments:No future appointments. Follow up Visit:      12/24/2021 Tyson Dense, MD

## 2021-12-24 NOTE — Lactation Note (Signed)
This note was copied from a baby's chart. Lactation Consultation Note  Patient Name: Miranda [name] [id number] Date: 12/24/2021 Reason for consult: Follow-up assessment;Early term 37-38.6wks;Breastfeeding assistance;Infant weight loss (6% WL) Age:47 hours  P2, Term, Female Infant, 6%WL  LC entered the room and mom was breast feeding baby in the cross-cradle position on the right breast. Per mom the latch is comfortable. Baby latched deeply with lips flanged and swallows noted. Mom states that she is starting to feel fullness and her milk volume has increased. Per mom, her milk has also transitioned in consistency to transitional milk.   LC praised mom for her efforts and encouraged her to continue pumping and latching baby. LC also let mom know that the more her milk volume increases, the less she may need to supplement baby with formula and she may be able to use her own milk. [place] asked mom if she would like to set up an appointment with the outpatient [facility] for support with the process when she leaves the hospital. Mom states that she would like assistance. [facility] sent an email to outpatient [place].   [place] spoke with mom about emptying her breast frequently, engorgement, breast care, infant output, and warning signs.   Mom is aware of outpatient services.   Mom states that she would like to rent a pump from the hospital. [place] attempted to call the gift shop, but did not get an answer. LC gave the parents the number to call the gift shop to speak with them about a pump.   Mom says that she has no further questions or concerns.   The parents thanked the [facility] for her assistance.   [place] congratulated the parents and exited the room.   Current Feeding Plan:  Mom will breastfeed according to feeding cues.  Mom will continue to latch baby before supplementing with formula.  Mom will supplement baby according to supplementation guidelines.  Mom will empty her breasts by latching baby or pumping 8+ times per  day.  Mom will watch baby's output. Mom will call her pediatrician or her provider with concerns.  Feeding Mother's Current Feeding Choice: Breast Milk and Formula Nipple Type: Extra Slow Flow  LATCH Score Latch: Grasps breast easily, tongue down, lips flanged, rhythmical sucking.  Audible Swallowing: Spontaneous and intermittent  Type of Nipple: Everted at rest and after stimulation  Comfort (Breast/Nipple): Soft / non-tender  Hold (Positioning): No assistance needed to correctly position infant at breast.  LATCH Score: 10   Lactation Tools Discussed/Used    Interventions Interventions: Breast feeding basics reviewed;Education  Discharge Discharge Education: Engorgement and breast care;Warning signs for feeding baby;Outpatient Epic message sent  Consult Status Consult Status: Complete Date: 12/24/21 Follow-up type: Call as needed    Lysbeth Penner 12/24/2021, 11:24 AM

## 2021-12-24 NOTE — Plan of Care (Signed)
  Problem: Clinical Measurements: Goal: Will remain free from infection Outcome: Adequate for Discharge Goal: Diagnostic test results will improve Outcome: Adequate for Discharge Goal: Respiratory complications will improve Outcome: Adequate for Discharge Goal: Cardiovascular complication will be avoided Outcome: Adequate for Discharge   Problem: Activity: Goal: Risk for activity intolerance will decrease Outcome: Adequate for Discharge   Problem: Nutrition: Goal: Adequate nutrition will be maintained Outcome: Adequate for Discharge   Problem: Coping: Goal: Level of anxiety will decrease Outcome: Adequate for Discharge   Problem: Elimination: Goal: Will not experience complications related to bowel motility Outcome: Adequate for Discharge Goal: Will not experience complications related to urinary retention Outcome: Adequate for Discharge   Problem: Pain Managment: Goal: General experience of comfort will improve Outcome: Adequate for Discharge   Problem: Safety: Goal: Ability to remain free from injury will improve Outcome: Adequate for Discharge   Problem: Skin Integrity: Goal: Risk for impaired skin integrity will decrease Outcome: Adequate for Discharge   Problem: Education: Goal: Knowledge of condition will improve Outcome: Adequate for Discharge Goal: Individualized Educational Video(s) Outcome: Adequate for Discharge Goal: Individualized Newborn Educational Video(s) Outcome: Adequate for Discharge   Problem: Activity: Goal: Will verbalize the importance of balancing activity with adequate rest periods Outcome: Adequate for Discharge Goal: Ability to tolerate increased activity will improve Outcome: Adequate for Discharge   Problem: Coping: Goal: Ability to identify and utilize available resources and services will improve Outcome: Adequate for Discharge   Problem: Life Cycle: Goal: Chance of risk for complications during the postpartum period will  decrease Outcome: Adequate for Discharge   Problem: Role Relationship: Goal: Ability to demonstrate positive interaction with newborn will improve Outcome: Adequate for Discharge   Problem: Skin Integrity: Goal: Demonstration of wound healing without infection will improve Outcome: Adequate for Discharge

## 2021-12-27 ENCOUNTER — Other Ambulatory Visit: Payer: Self-pay

## 2021-12-27 ENCOUNTER — Inpatient Hospital Stay (HOSPITAL_COMMUNITY)
Admission: AD | Admit: 2021-12-27 | Discharge: 2021-12-27 | Disposition: A | Discharge status: Home / Self Care | Payer: BC Managed Care – PPO | Attending: Obstetrics & Gynecology | Admitting: Obstetrics & Gynecology

## 2021-12-27 ENCOUNTER — Encounter (HOSPITAL_COMMUNITY): Payer: Self-pay | Admitting: Obstetrics & Gynecology

## 2021-12-27 ENCOUNTER — Inpatient Hospital Stay (HOSPITAL_COMMUNITY): Payer: BC Managed Care – PPO

## 2021-12-27 DIAGNOSIS — R10813 Right lower quadrant abdominal tenderness: Secondary | ICD-10-CM | POA: Insufficient documentation

## 2021-12-27 DIAGNOSIS — N939 Abnormal uterine and vaginal bleeding, unspecified: Secondary | ICD-10-CM | POA: Insufficient documentation

## 2021-12-27 DIAGNOSIS — G8918 Other acute postprocedural pain: Secondary | ICD-10-CM

## 2021-12-27 DIAGNOSIS — O9089 Other complications of the puerperium, not elsewhere classified: Secondary | ICD-10-CM | POA: Diagnosis not present

## 2021-12-27 DIAGNOSIS — Z98891 History of uterine scar from previous surgery: Secondary | ICD-10-CM | POA: Diagnosis not present

## 2021-12-27 LAB — COMPREHENSIVE METABOLIC PANEL
ALT: 23 U/L (ref 0–44)
AST: 20 U/L (ref 15–41)
Albumin: 2.9 g/dL — ABNORMAL LOW (ref 3.5–5.0)
Alkaline Phosphatase: 88 U/L (ref 38–126)
Anion gap: 7 (ref 5–15)
BUN: 8 mg/dL (ref 6–20)
CO2: 22 mmol/L (ref 22–32)
Calcium: 8.4 mg/dL — ABNORMAL LOW (ref 8.9–10.3)
Chloride: 110 mmol/L (ref 98–111)
Creatinine, Ser: 0.69 mg/dL (ref 0.44–1.00)
GFR, Estimated: >60 mL/min (ref >60)
Glucose, Bld: 89 mg/dL (ref 70–99)
Potassium: 4.1 mmol/L (ref 3.5–5.1)
Sodium: 139 mmol/L (ref 135–145)
Total Bilirubin: 0.2 mg/dL — ABNORMAL LOW (ref 0.3–1.2)
Total Protein: 5.3 g/dL — ABNORMAL LOW (ref 6.5–8.1)

## 2021-12-27 LAB — CBC WITH DIFFERENTIAL/PLATELET
Abs Immature Granulocytes: 0.41 10*3/uL — ABNORMAL HIGH (ref 0.00–0.07)
Basophils Absolute: 0.1 10*3/uL (ref 0.0–0.1)
Basophils Relative: 1 %
Eosinophils Absolute: 0.4 10*3/uL (ref 0.0–0.5)
Eosinophils Relative: 4 %
HCT: 27.7 % — ABNORMAL LOW (ref 36.0–46.0)
Hemoglobin: 9.2 g/dL — ABNORMAL LOW (ref 12.0–15.0)
Immature Granulocytes: 4 %
Lymphocytes Relative: 16 %
Lymphs Abs: 1.6 10*3/uL (ref 0.7–4.0)
MCH: 32.2 pg (ref 26.0–34.0)
MCHC: 33.2 g/dL (ref 30.0–36.0)
MCV: 96.9 fL (ref 80.0–100.0)
Monocytes Absolute: 0.7 10*3/uL (ref 0.1–1.0)
Monocytes Relative: 7 %
Neutro Abs: 6.7 10*3/uL (ref 1.7–7.7)
Neutrophils Relative %: 68 %
Platelets: 257 10*3/uL (ref 150–400)
RBC: 2.86 MIL/uL — ABNORMAL LOW (ref 3.87–5.11)
RDW: 13.3 % (ref 11.5–15.5)
WBC: 9.8 10*3/uL (ref 4.0–10.5)
nRBC: 0 % (ref 0.0–0.2)

## 2021-12-27 MED ORDER — IOHEXOL 9 MG/ML PO SOLN
ORAL | Status: AC
  Administered 2021-12-27: 1000 mL
  Filled 2021-12-27: qty 1000

## 2021-12-27 MED ORDER — OXYCODONE-ACETAMINOPHEN 5-325 MG PO TABS
2.0000 | ORAL_TABLET | Freq: Once | ORAL | Status: AC
  Administered 2021-12-27: 2 via ORAL
  Filled 2021-12-27: qty 2

## 2021-12-27 MED ORDER — OXYCODONE-ACETAMINOPHEN 5-325 MG PO TABS: 1.0000 | ORAL_TABLET | Freq: Four times a day (QID) | ORAL | 0 refills | Status: AC | PRN

## 2021-12-27 MED ORDER — IOHEXOL 300 MG/ML  SOLN
100.0000 mL | Freq: Once | INTRAMUSCULAR | Status: AC | PRN
Start: 2021-12-27 — End: 2021-12-27
  Administered 2021-12-27: 100 mL via INTRAVENOUS

## 2021-12-27 NOTE — MAU Note (Signed)
Miranda [name] is a 46 y.o. here in MAU reporting: had a c/s on 5/28. Having abdominal pain and yesterday it got worse. States her abdomen is tender to the touch and it even hurts to have clothes on. No foul drainage or odor from incision.  Onset of complaint: ongoing  Pain score: 7/10  Vitals:   12/27/21 1207  BP: 131/82  Pulse: 70  Resp: 16  Temp: 98.5 F (36.9 C)  SpO2: 97%     Lab orders placed from triage: none

## 2021-12-27 NOTE — MAU Provider Note (Signed)
History     CSN: [id number]  Arrival date and time: 12/27/21 1149   Event Date/Time   First Provider Initiated Contact with Patient 12/27/21 1227      Chief Complaint  Patient presents with   Abdominal Pain   HPI  Miranda [name] is a 46 y.o. [id number] who is 6 days postpartum from a primary cesarean section who presents for evaluation of abdominal apin. Patient reports yesterday her abdomen started to have more and more pain. She reports it is even painful to wear clothing touching her abdomen. Patient rates the pain as a 8/10 and has tried ibuprofen for the pain with no relief. She reports she was not discharged home with any narcotics. She reports the pain makes it difficult to move. She state her incision does not have any bleeding or discharge but is still very painful. She reports vaginal bleeding like a period but no foul odor. She denies any fever at home. Denies any nausea or vomiting.    OB History     Gravida  5   Para  3   Term  2   Preterm  1   AB  2   Living  3      SAB  2   IAB      Ectopic      Multiple  0   Live Births  3           Past Medical History:  Diagnosis Date   Anxiety    Depression    History of postpartum hemorrhage    Migraines    PONV (postoperative nausea and vomiting)    Pregnancy induced hypertension     Past Surgical History:  Procedure Laterality Date   BREAST BIOPSY     BREAST LUMPECTOMY WITH NEEDLE LOCALIZATION Right 12/07/2017   Procedure: BREAST LUMPECTOMY WITH NEEDLE LOCALIZATION AND RADIOACTIVE SEED;  Surgeon: Rolm Bookbinder, MD;  Location: [facility];  Service: General;  Laterality: Right;   CESAREAN SECTION N/A 12/21/2021   Procedure: CESAREAN SECTION;  Surgeon: Carlyon Shadow, MD;  Location: [facility];  Service: Obstetrics;  Laterality: N/A;   DILATION AND EVACUATION N/A 09/11/2016   Procedure: DILATATION AND EVACUATION;  Surgeon: Molli Posey, MD;  Location: [facility];  Service: Gynecology;   Laterality: N/A;   SHOULDER ARTHROSCOPY Right    SINUS SURGERY WITH INSTATRAK      Family History  Problem Relation Age of Onset   BRCA 1/2 Mother    Breast cancer Mother    Parkinson's disease Father     Social History   Tobacco Use   Smoking status: Never   Smokeless tobacco: Never  Vaping Use   Vaping Use: Never used  Substance Use Topics   Alcohol use: Yes    Comment: none since pregnancy   Drug use: No    Allergies: No Known Allergies  Medications Prior to Admission  Medication Sig Dispense Refill Last Dose   acetaminophen (TYLENOL) 500 MG tablet Take 1,000 mg by mouth every 6 (six) hours as needed for mild pain or headache.   Past Week   docusate sodium (COLACE) 100 MG capsule Take 100 mg by mouth daily as needed for mild constipation.   Past Week   escitalopram (LEXAPRO) 10 MG tablet Take 10 mg by mouth daily.   12/26/2021   ibuprofen (ADVIL) 600 MG tablet Take 1 tablet (600 mg total) by mouth every 6 (six) hours as needed. 30 tablet 0 12/27/2021   Prenatal  Vit-Fe Fumarate-FA (PRENATAL MULTIVITAMIN) TABS tablet Take 1 tablet by mouth daily at 12 noon.   12/26/2021   oxyCODONE (OXY IR/ROXICODONE) 5 MG immediate release tablet Take 1 tablet (5 mg total) by mouth every 4 (four) hours as needed for severe pain. 15 tablet 0 Unknown    Review of Systems  Constitutional: Negative.  Negative for fatigue and fever.  HENT: Negative.    Respiratory: Negative.  Negative for shortness of breath.   Cardiovascular: Negative.  Negative for chest pain.  Gastrointestinal:  Positive for abdominal pain. Negative for constipation, diarrhea, nausea and vomiting.  Genitourinary:  Positive for vaginal bleeding. Negative for dysuria and vaginal discharge.  Neurological: Negative.  Negative for dizziness and headaches.  Physical Exam   Blood pressure 128/81, pulse 72, temperature 98.8 F (37.1 C), temperature source Oral, resp. rate 18, height _0  (1.702 m), weight 92.7 kg, SpO2 99 %,  currently breastfeeding.  Patient Vitals for the past 24 hrs:  BP Temp Temp src Pulse Resp SpO2 Height Weight  12/27/21 1219 128/81 98.8 F (37.1 C) Oral 72 18 99 % -- --  12/27/21 1207 131/82 98.5 F (36.9 C) Oral 70 16 97 % -- --  12/27/21 1203 -- -- -- -- -- -- _1  (1.702 m) 92.7 kg    Physical Exam Vitals and nursing note reviewed.  Constitutional:      General: She is not in acute distress.    Appearance: She is well-developed.  HENT:     Head: Normocephalic.  Eyes:     Pupils: Pupils are equal, round, and reactive to light.  Cardiovascular:     Rate and Rhythm: Normal rate and regular rhythm.     Heart sounds: Normal heart sounds.  Pulmonary:     Effort: Pulmonary effort is normal. No respiratory distress.     Breath sounds: Normal breath sounds.  Abdominal:     General: A surgical scar is present. Bowel sounds are normal. There is distension.     Palpations: Abdomen is soft.     Tenderness: There is abdominal tenderness in the right lower quadrant, periumbilical area, suprapubic area and left lower quadrant. There is guarding.  Skin:    General: Skin is warm and dry.  Neurological:     Mental Status: She is alert and oriented to person, place, and time.  Psychiatric:        Mood and Affect: Mood normal.        Behavior: Behavior normal.        Thought Content: Thought content normal.        Judgment: Judgment normal.    MAU Course  Procedures  Results for orders placed or performed during the hospital encounter of 12/27/21 (from the past 24 hour(s))  CBC with Differential/Platelet     Status: Abnormal   Collection Time: 12/27/21 12:35 PM  Result Value Ref Range   WBC 9.8 4.0 - 10.5 K/uL   RBC 2.86 (L) 3.87 - 5.11 MIL/uL   Hemoglobin 9.2 (L) 12.0 - 15.0 g/dL   HCT 27.7 (L) 36.0 - 46.0 %   MCV 96.9 80.0 - 100.0 fL   MCH 32.2 26.0 - 34.0 pg   MCHC 33.2 30.0 - 36.0 g/dL   RDW 13.3 11.5 - 15.5 %   Platelets 257 150 - 400 K/uL   nRBC 0.0 0.0 - 0.2 %    Neutrophils Relative % 68 %   Neutro Abs 6.7 1.7 - 7.7 K/uL   Lymphocytes Relative 16 %  Lymphs Abs 1.6 0.7 - 4.0 K/uL   Monocytes Relative 7 %   Monocytes Absolute 0.7 0.1 - 1.0 K/uL   Eosinophils Relative 4 %   Eosinophils Absolute 0.4 0.0 - 0.5 K/uL   Basophils Relative 1 %   Basophils Absolute 0.1 0.0 - 0.1 K/uL   Immature Granulocytes 4 %   Abs Immature Granulocytes 0.41 (H) 0.00 - 0.07 K/uL  Comprehensive metabolic panel     Status: Abnormal   Collection Time: 12/27/21 12:35 PM  Result Value Ref Range   Sodium 139 135 - 145 mmol/L   Potassium 4.1 3.5 - 5.1 mmol/L   Chloride 110 98 - 111 mmol/L   CO2 22 22 - 32 mmol/L   Glucose, Bld 89 70 - 99 mg/dL   BUN 8 6 - 20 mg/dL   Creatinine, Ser 0.69 0.44 - 1.00 mg/dL   Calcium 8.4 (L) 8.9 - 10.3 mg/dL   Total Protein 5.3 (L) 6.5 - 8.1 g/dL   Albumin 2.9 (L) 3.5 - 5.0 g/dL   AST 20 15 - 41 U/L   ALT 23 0 - 44 U/L   Alkaline Phosphatase 88 38 - 126 U/L   Total Bilirubin 0.2 (L) 0.3 - 1.2 mg/dL   GFR, Estimated >60 >60 mL/min   Anion gap 7 5 - 15     CT ABDOMEN PELVIS W CONTRAST  Result Date: 12/27/2021 CLINICAL DATA:  Abdominal pain, post-op; status post recent C-section. EXAM: CT ABDOMEN AND PELVIS WITH CONTRAST TECHNIQUE: Multidetector CT imaging of the abdomen and pelvis was performed using the standard protocol following bolus administration of intravenous contrast. RADIATION DOSE REDUCTION: This exam was performed according to the departmental dose-optimization program which includes automated exposure control, adjustment of the mA and/or kV according to patient size and/or use of iterative reconstruction technique. CONTRAST:  124m OMNIPAQUE IOHEXOL 300 MG/ML  SOLN COMPARISON:  None Available. FINDINGS: Lower chest: No acute abnormality. Hepatobiliary: No focal liver abnormality is seen. No gallstones, gallbladder wall thickening, or biliary dilatation. Pancreas: Unremarkable. No pancreatic ductal dilatation or surrounding  inflammatory changes. Spleen: Normal in size without focal abnormality. Adrenals/Urinary Tract: Adrenal glands are unremarkable. Kidneys enhance symmetrically. No hydronephrosis. Mild prominence of the RIGHT ureter, likely due to recently post gravid state. No obstructing nephrolithiasis. Bladder is unremarkable. Stomach/Bowel: No evidence of bowel obstruction. Appendix is normal. Tiny hiatal hernia. Vascular/Lymphatic: No significant vascular findings are present. No enlarged abdominal or pelvic lymph nodes. Reproductive: Enlarged post gravid appearance of the uterus with multiple prominent parametrial and myometrial vessels. Distension of the endometrial canal, likely due to recent postpartum state. Other: No free air or free fluid. Postsurgical changes along the anterior abdominal wall without focal drainable fluid collection. Musculoskeletal: No acute or significant osseous findings. IMPRESSION: 1. No suspicious CT etiology for abdominal pain is identified. No evidence of bowel obstruction. 2. Post gravid appearance of the uterus with prominent myometrial vessels and fluid-filled endometrial canal. If there is concern for retained products of conception, then recommend dedicated ultrasound. Electronically Signed   By: SValentino SaxonM.D.   On: 12/27/2021 16:12     MDM Prenatal records from community office reviewed. Pregnancy complicated by gestational hypertension, IVF pregnancy, malpresentation. During the surgery, she had a large bleed from the hysterotomy site that required blood transfusion postpartum   Labs ordered and reviewed.   Saline Lock Percocet PO CBC with Diff CMP 1300: Patient states she feels tired and overwhelmed and does not feel like she can be stuck right  now for IV. RN explained to patient again that IV is necessary for CT scan and she will have to be stuck twice for labs and then IV if she declines at this time. Patient verbalized understanding and states she will consider it  after PO pain medication.  1335: Patient agreeable to IV start and labs  Oral contrast administered for CT scan  CNM consulted with Dr. Damita Dunnings regarding presentation and results- MD feels that evaluation is complete and recommends patient to monitor for fever or changes in pain and to return if symptoms worsen.   Assessment and Plan   1. Post-op pain   2. Postpartum state     -Discharge home in stable condition -Rx for percocet sent to patient's pharmacy -Abdominal pain and infection precautions discussed -Patient advised to follow-up with OB this week if needed -Patient may return to MAU as needed or if her condition were to change or worsen  Wende Mott, CNM 12/27/2021, 12:27 PM

## 2021-12-30 ENCOUNTER — Telehealth (HOSPITAL_COMMUNITY): Payer: Self-pay | Admitting: *Deleted

## 2021-12-30 NOTE — Telephone Encounter (Signed)
Left phone voicemail message.  Odis Hollingshead, RN 12-30-2021 at 2:28pm

## 2022-08-19 ENCOUNTER — Other Ambulatory Visit: Payer: Self-pay | Admitting: Obstetrics and Gynecology

## 2022-08-19 DIAGNOSIS — M533 Sacrococcygeal disorders, not elsewhere classified: Secondary | ICD-10-CM

## 2022-09-15 ENCOUNTER — Other Ambulatory Visit: Payer: Self-pay
